# Patient Record
Sex: Male | Born: 1965
Health system: Southern US, Community
[De-identification: ages and names within clinical notes are randomized; demographics above are authoritative.]

## PROBLEM LIST (undated history)

## (undated) DIAGNOSIS — K8020 Calculus of gallbladder without cholecystitis without obstruction: Secondary | ICD-10-CM

## (undated) DIAGNOSIS — K219 Gastro-esophageal reflux disease without esophagitis: Secondary | ICD-10-CM

## (undated) DIAGNOSIS — E785 Hyperlipidemia, unspecified: Secondary | ICD-10-CM

## (undated) DIAGNOSIS — I10 Essential (primary) hypertension: Secondary | ICD-10-CM

## (undated) DIAGNOSIS — I2510 Atherosclerotic heart disease of native coronary artery without angina pectoris: Secondary | ICD-10-CM

## (undated) HISTORY — DX: Essential (primary) hypertension: I10

## (undated) HISTORY — DX: Gastro-esophageal reflux disease without esophagitis: K21.9

## (undated) HISTORY — DX: Atherosclerotic heart disease of native coronary artery without angina pectoris: I25.10

## (undated) HISTORY — DX: Hyperlipidemia, unspecified: E78.5

## (undated) HISTORY — PX: KNEE ARTHROSCOPY: SUR90

---

## 2008-04-22 ENCOUNTER — Encounter: Admission: RE | Admit: 2008-04-22 | Discharge: 2008-04-22 | Payer: Self-pay | Admitting: Orthopedic Surgery

## 2013-05-25 DIAGNOSIS — M25561 Pain in right knee: Secondary | ICD-10-CM | POA: Insufficient documentation

## 2013-05-28 ENCOUNTER — Other Ambulatory Visit: Payer: Self-pay | Admitting: Orthopedic Surgery

## 2013-05-28 DIAGNOSIS — M25561 Pain in right knee: Secondary | ICD-10-CM

## 2013-05-28 DIAGNOSIS — R531 Weakness: Secondary | ICD-10-CM

## 2013-05-30 ENCOUNTER — Ambulatory Visit
Admission: RE | Admit: 2013-05-30 | Discharge: 2013-05-30 | Disposition: A | Discharge status: Home / Self Care | Payer: BC Managed Care – PPO | Source: Ambulatory Visit | Attending: Orthopedic Surgery | Admitting: Orthopedic Surgery

## 2013-05-30 DIAGNOSIS — R531 Weakness: Secondary | ICD-10-CM

## 2013-05-30 DIAGNOSIS — M25561 Pain in right knee: Secondary | ICD-10-CM

## 2013-06-21 DIAGNOSIS — Z9889 Other specified postprocedural states: Secondary | ICD-10-CM | POA: Insufficient documentation

## 2013-06-21 DIAGNOSIS — M545 Low back pain, unspecified: Secondary | ICD-10-CM | POA: Insufficient documentation

## 2016-02-03 DIAGNOSIS — J019 Acute sinusitis, unspecified: Secondary | ICD-10-CM | POA: Diagnosis not present

## 2016-02-03 DIAGNOSIS — R030 Elevated blood-pressure reading, without diagnosis of hypertension: Secondary | ICD-10-CM | POA: Diagnosis not present

## 2016-04-09 DIAGNOSIS — Z0001 Encounter for general adult medical examination with abnormal findings: Secondary | ICD-10-CM | POA: Diagnosis not present

## 2016-04-09 DIAGNOSIS — I10 Essential (primary) hypertension: Secondary | ICD-10-CM | POA: Diagnosis not present

## 2016-04-29 DIAGNOSIS — L400 Psoriasis vulgaris: Secondary | ICD-10-CM | POA: Diagnosis not present

## 2016-12-07 DIAGNOSIS — E782 Mixed hyperlipidemia: Secondary | ICD-10-CM | POA: Diagnosis not present

## 2016-12-07 DIAGNOSIS — I10 Essential (primary) hypertension: Secondary | ICD-10-CM | POA: Diagnosis not present

## 2017-04-12 DIAGNOSIS — N183 Chronic kidney disease, stage 3 (moderate): Secondary | ICD-10-CM | POA: Diagnosis not present

## 2017-04-12 DIAGNOSIS — R61 Generalized hyperhidrosis: Secondary | ICD-10-CM | POA: Diagnosis not present

## 2017-04-12 DIAGNOSIS — R7301 Impaired fasting glucose: Secondary | ICD-10-CM | POA: Diagnosis not present

## 2017-04-12 DIAGNOSIS — Z23 Encounter for immunization: Secondary | ICD-10-CM | POA: Diagnosis not present

## 2017-04-12 DIAGNOSIS — E782 Mixed hyperlipidemia: Secondary | ICD-10-CM | POA: Diagnosis not present

## 2017-04-12 DIAGNOSIS — I10 Essential (primary) hypertension: Secondary | ICD-10-CM | POA: Diagnosis not present

## 2017-04-12 DIAGNOSIS — Z0000 Encounter for general adult medical examination without abnormal findings: Secondary | ICD-10-CM | POA: Diagnosis not present

## 2017-04-29 DIAGNOSIS — I10 Essential (primary) hypertension: Secondary | ICD-10-CM | POA: Diagnosis not present

## 2017-04-29 DIAGNOSIS — E7849 Other hyperlipidemia: Secondary | ICD-10-CM | POA: Diagnosis not present

## 2017-04-29 DIAGNOSIS — N183 Chronic kidney disease, stage 3 (moderate): Secondary | ICD-10-CM | POA: Diagnosis not present

## 2017-04-29 DIAGNOSIS — E669 Obesity, unspecified: Secondary | ICD-10-CM | POA: Diagnosis not present

## 2017-05-06 DIAGNOSIS — I10 Essential (primary) hypertension: Secondary | ICD-10-CM | POA: Diagnosis not present

## 2017-05-31 ENCOUNTER — Encounter (HOSPITAL_COMMUNITY): Payer: Self-pay

## 2018-03-04 DIAGNOSIS — S01512A Laceration without foreign body of oral cavity, initial encounter: Secondary | ICD-10-CM | POA: Diagnosis not present

## 2018-03-10 DIAGNOSIS — Z23 Encounter for immunization: Secondary | ICD-10-CM | POA: Diagnosis not present

## 2018-03-10 DIAGNOSIS — S01512A Laceration without foreign body of oral cavity, initial encounter: Secondary | ICD-10-CM | POA: Diagnosis not present

## 2018-04-20 DIAGNOSIS — Z125 Encounter for screening for malignant neoplasm of prostate: Secondary | ICD-10-CM | POA: Diagnosis not present

## 2018-04-20 DIAGNOSIS — E782 Mixed hyperlipidemia: Secondary | ICD-10-CM | POA: Diagnosis not present

## 2018-04-20 DIAGNOSIS — Z0000 Encounter for general adult medical examination without abnormal findings: Secondary | ICD-10-CM | POA: Diagnosis not present

## 2018-04-20 DIAGNOSIS — I10 Essential (primary) hypertension: Secondary | ICD-10-CM | POA: Diagnosis not present

## 2018-10-19 DIAGNOSIS — I10 Essential (primary) hypertension: Secondary | ICD-10-CM | POA: Diagnosis not present

## 2018-12-18 DIAGNOSIS — Z1159 Encounter for screening for other viral diseases: Secondary | ICD-10-CM | POA: Diagnosis not present

## 2018-12-21 DIAGNOSIS — D122 Benign neoplasm of ascending colon: Secondary | ICD-10-CM | POA: Diagnosis not present

## 2018-12-21 DIAGNOSIS — Z1211 Encounter for screening for malignant neoplasm of colon: Secondary | ICD-10-CM | POA: Diagnosis not present

## 2018-12-21 DIAGNOSIS — D123 Benign neoplasm of transverse colon: Secondary | ICD-10-CM | POA: Diagnosis not present

## 2019-01-31 ENCOUNTER — Encounter: Payer: Self-pay | Admitting: Physical Medicine and Rehabilitation

## 2019-01-31 ENCOUNTER — Telehealth: Payer: Self-pay | Admitting: Physical Medicine and Rehabilitation

## 2019-01-31 ENCOUNTER — Other Ambulatory Visit: Payer: Self-pay

## 2019-01-31 ENCOUNTER — Ambulatory Visit (INDEPENDENT_AMBULATORY_CARE_PROVIDER_SITE_OTHER): Payer: BC Managed Care – PPO | Admitting: Physical Medicine and Rehabilitation

## 2019-01-31 VITALS — BP 146/95 | HR 69 | Ht 68.0 in | Wt 200.0 lb

## 2019-01-31 DIAGNOSIS — M5416 Radiculopathy, lumbar region: Secondary | ICD-10-CM

## 2019-01-31 DIAGNOSIS — M5441 Lumbago with sciatica, right side: Secondary | ICD-10-CM

## 2019-01-31 DIAGNOSIS — Q762 Congenital spondylolisthesis: Secondary | ICD-10-CM

## 2019-01-31 DIAGNOSIS — M48062 Spinal stenosis, lumbar region with neurogenic claudication: Secondary | ICD-10-CM

## 2019-01-31 DIAGNOSIS — M4316 Spondylolisthesis, lumbar region: Secondary | ICD-10-CM

## 2019-01-31 DIAGNOSIS — G8929 Other chronic pain: Secondary | ICD-10-CM

## 2019-01-31 NOTE — Progress Notes (Signed)
Gregory Torres - 53 y.o. male MRN [id number]  Date of birth: 1966/02/06  Office Visit Note: Visit Date: 01/31/2019 PCP: Soundra Pilon, FNP Referred by: No ref. provider found  Subjective: Chief Complaint  Patient presents with   Right Thigh - Pain   Left Thigh - Pain   Right Leg - Pain, Numbness   Lower Back - Pain   HPI: Gregory Torres is a 53 y.o. male who comes in today For evaluation and management of chronic worsening several weeks of midline sacral pain with some referral into both buttocks and the back of both thighs but also down the right leg and more of an L5 distribution.  He is a patient that I last saw him more than 5 years ago.  We completed epidural injection with good relief of his pain that he was having at the time.  He was having more left-sided radicular complaints at that time.  MRI from that time.  Is reviewed again today with images and spine model and shown to the patient.  He is reviewed below.  He did have moderate narrowing at L3-4 with bilateral pars defects at L5-S1 with some foraminal narrowing.  He has not had any new injury he just said the pain started several weeks ago with a fairly abrupt onset without any inciting problem.  Rates his pain as an 8 out of 10 which is constant sharp and burning pain in both buttocks and thighs.  The pain down the leg is into the anterior shin on the right without weakness.  Some paresthesia not as bad as his lower back and buttock pain.  He has had no bowel or bladder issues no focal weakness.  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  He has had some issues with his knees.  He has not played soccer in several months do to the coronavirus.  He does try to stay active otherwise and does try to exercise somewhat.  Symptoms are worse with walking and bending and actually laying flat does not necessarily endorse pain with sitting.  He has tried anti-inflammatories  as well as heat and ice without much relief.  Review of Systems  Constitutional: Negative for chills, fever, malaise/fatigue and weight loss.  HENT: Negative for hearing loss and sinus pain.   Eyes: Negative for blurred vision, double vision and photophobia.  Respiratory: Negative for cough and shortness of breath.   Cardiovascular: Negative for chest pain, palpitations and leg swelling.  Gastrointestinal: Negative for abdominal pain, nausea and vomiting.  Genitourinary: Negative for flank pain.  Musculoskeletal: Positive for back pain and joint pain. Negative for myalgias.       Right hip and leg pain with paresthesia  Skin: Negative for itching and rash.  Neurological: Positive for tingling. Negative for tremors, focal weakness and weakness.  Endo/Heme/Allergies: Negative.   Psychiatric/Behavioral: Negative for depression.  All other systems reviewed and are negative.  Otherwise per HPI.  Assessment & Plan: Visit Diagnoses:  1. Spinal stenosis of lumbar region with neurogenic claudication   2. Lumbar radiculopathy   3. Chronic bilateral low back pain with right-sided sciatica   4. Congenital spondylolysis   5. Spondylolisthesis of lumbar region     Plan: Findings:  Chronic worsening several months of sacral pain some referral in the buttocks and thighs particularly right-sided L5 symptoms.  Old MRI showing moderate stenosis at L3-4 as well as arthritic changes at  L4-5 and bilateral pars defects at L5-S1.  Epidural injection in the past was very beneficial for almost 4 years despite the fact he is pretty active playing soccer.  No real new trauma or other issues.  I do not feel like today there is any imaging that needs to be done.  I think he would benefit from epidural injection from a general approach at L4-5 once again or maybe L5-S1 given his L5 symptoms.  If he did get much relief with that initially I would look at updated MRI of the lumbar spine.  We talked about activity  modification and core strengthening as well as stretching of his hamstrings with the pars defects.  We talked about neural flossing and he is going to start doing that.  He will continue with current medications.  We will see him back for the epidural injection.    Meds & Orders: No orders of the defined types were placed in this encounter.  No orders of the defined types were placed in this encounter.   Follow-up: Return for Right L5-S1 interlaminar steroid injection.   Procedures: No procedures performed  No notes on file   Clinical History: MRI LUMBAR SPINE WITHOUT CONTRAST   Technique:  Multiplanar and multiecho pulse sequences of the lumbar spine were obtained without intravenous contrast.   Comparison: None   Findings: The lowest full intervertebral disk space is labeled L5- S1.  If procedural intervention is to be performed, careful correlation with this numbering strategy is recommended.   The conus medullaris appears unremarkable.  Conus level: L1.   Bilateral pars defects L5 are associated with 9 mm of grade II anterolisthesis of L5 on S1.   Intervertebral disc desiccation noted at all levels between L3 and S1.   Additional findings at individual levels are as follows:   L2-3:  Unremarkable.   L3-4:  Disc bulge noted with central disc protrusion. The AP diameter of the thecal sac is 8 mm compatible with moderate central stenosis. Mild bilateral foraminal stenosis is noted along with mild bilateral subarticular lateral recess stenosis.   L4-5:  Disc bulge with central shallow disc protrusion noted. Bilateral facet arthropathy is present.  There is mild left and borderline right foraminal stenosis.  No overt central stenosis.   L5-S1:  Disc uncovering noted with shallow central disc protrusion, facet arthropathy, and pars defects.  There is also with prominence of disc material in the right neural foramen compatible with disc protrusion.  These findings cause  moderate left and severe right foraminal stenosis.   IMPRESSION:   1.  Multilevel findings as detailed above, but with the most striking finding being the severe right foraminal stenosis at L5-S1 due to pars defects, anterolisthesis, and right foraminal disc protrusion.   He reports that he has never smoked. He has never used smokeless tobacco. No results for input(s): HGBA1C, LABURIC in the last 8760 hours.  Objective:  VS:  HT:5\' 8"  (172.7 cm)    WT:200 lb (90.7 kg)   BMI:30.42     BP:(!) 146/95   HR:69bpm   TEMP: ( )   RESP:  Physical Exam Vitals signs and nursing note reviewed.  Constitutional:      General: He is not in acute distress.    Appearance: He is well-developed.  HENT:     Head: Normocephalic and atraumatic.     Nose: Nose normal.     Mouth/Throat:     Mouth: Mucous membranes are moist.     Pharynx:  Oropharynx is clear.  Eyes:     Conjunctiva/sclera: Conjunctivae normal.     Pupils: Pupils are equal, round, and reactive to light.  Neck:     Musculoskeletal: Normal range of motion and neck supple.     Trachea: No tracheal deviation.  Cardiovascular:     Rate and Rhythm: Normal rate and regular rhythm.     Pulses: Normal pulses.  Pulmonary:     Effort: Pulmonary effort is normal.     Breath sounds: Normal breath sounds.  Abdominal:     General: There is no distension.     Palpations: Abdomen is soft.     Tenderness: There is no guarding or rebound.  Musculoskeletal:        General: No deformity.     Right lower leg: No edema.     Left lower leg: No edema.     Comments: Patient ambulates without aid somewhat slow to rise to full standing position some pain with extension and facet loading of the lumbar spine.  No focal trigger points noted.  Some pain over the right PSIS more than the left.  No pain over the greater trochanters no pain with hip rotation.  He has a positive slump test on the right.  Skin:    General: Skin is warm and dry.     Findings: No  erythema or rash.  Neurological:     General: No focal deficit present.     Mental Status: He is alert and oriented to person, place, and time.     Motor: No abnormal muscle tone.     Coordination: Coordination normal.     Gait: Gait normal.  Psychiatric:        Mood and Affect: Mood normal.        Behavior: Behavior normal.        Thought Content: Thought content normal.     Ortho Exam Imaging: No results found.  Past Medical/Family/Surgical/Social History: Medications & Allergies reviewed per EMR, new medications updated. Patient Active Problem List   Diagnosis Date Noted   Low back pain 06/21/2013   S/P arthroscopy of knee 06/21/2013   Right knee pain 05/25/2013   History reviewed. No pertinent past medical history. History reviewed. No pertinent family history. History reviewed. No pertinent surgical history. Social History   Occupational History   Not on file  Tobacco Use   Smoking status: Never Smoker   Smokeless tobacco: Never Used  Substance and Sexual Activity   Alcohol use: Not on file   Drug use: Not on file   Sexual activity: Not on file

## 2019-01-31 NOTE — Progress Notes (Signed)
.  Numeric Pain Rating Scale and Functional Assessment Average Pain 8 Pain Right Now 7 My pain is constant, sharp and burning Pain is worse with: walking and bending Pain improves with: nothing   In the last MONTH (on 0-10 scale) has pain interfered with the following?  1. General activity like being  able to carry out your everyday physical activities such as walking, climbing stairs, carrying groceries, or moving a chair?  Rating(8)  2. Relation with others like being able to carry out your usual social activities and roles such as  activities at home, at work and in your community. Rating(8)  3. Enjoyment of life such that you have  been bothered by emotional problems such as feeling anxious, depressed or irritable?  Rating(3)

## 2019-02-01 NOTE — Telephone Encounter (Signed)
PA has been initiated faxed notes to [phone]

## 2019-02-07 NOTE — Telephone Encounter (Signed)
Faxed more clinical notes, case [id number] is still pending.

## 2019-02-12 ENCOUNTER — Encounter: Payer: BC Managed Care – PPO | Admitting: Physical Medicine and Rehabilitation

## 2019-02-12 ENCOUNTER — Other Ambulatory Visit: Payer: Self-pay | Admitting: Physical Medicine and Rehabilitation

## 2019-02-12 ENCOUNTER — Telehealth: Payer: Self-pay | Admitting: *Deleted

## 2019-02-12 DIAGNOSIS — M48062 Spinal stenosis, lumbar region with neurogenic claudication: Secondary | ICD-10-CM

## 2019-02-12 DIAGNOSIS — M5416 Radiculopathy, lumbar region: Secondary | ICD-10-CM

## 2019-02-12 MED ORDER — PREDNISONE 50 MG PO TABS: ORAL_TABLET | ORAL | 0 refills | Status: DC

## 2019-02-12 MED ORDER — MELOXICAM 15 MG PO TABS: 15.0000 mg | ORAL_TABLET | Freq: Every day | ORAL | 0 refills | Status: DC

## 2019-02-12 NOTE — Telephone Encounter (Signed)
Best we can offer is prednisone for 3 to 5 days with other anti-inflammatory and Pt/short course at Dr. Ronnie Derby office, our office or [facility] - if no better then OV to try and get epidural approval. If worsens will think about new MRI if they would approve that.

## 2019-02-12 NOTE — Telephone Encounter (Signed)
Both sent to pharmacy on record. Instructions on medication for meloxicam to start after prednisone finished. Also note it could increase his blood pressure temporarily. Not a long term medication.

## 2019-02-12 NOTE — Telephone Encounter (Signed)
Called pt and advised per Dr. Newton.  

## 2019-02-12 NOTE — Telephone Encounter (Signed)
Patient would like to try prednisone and anti-inflammatory.

## 2019-03-08 DIAGNOSIS — L218 Other seborrheic dermatitis: Secondary | ICD-10-CM | POA: Diagnosis not present

## 2019-03-08 DIAGNOSIS — L821 Other seborrheic keratosis: Secondary | ICD-10-CM | POA: Diagnosis not present

## 2019-03-08 DIAGNOSIS — L814 Other melanin hyperpigmentation: Secondary | ICD-10-CM | POA: Diagnosis not present

## 2019-03-08 DIAGNOSIS — B078 Other viral warts: Secondary | ICD-10-CM | POA: Diagnosis not present

## 2019-03-09 ENCOUNTER — Other Ambulatory Visit: Payer: Self-pay | Admitting: Physical Medicine and Rehabilitation

## 2019-03-09 NOTE — Telephone Encounter (Signed)
Please advise 

## 2019-03-27 DIAGNOSIS — M25562 Pain in left knee: Secondary | ICD-10-CM | POA: Diagnosis not present

## 2019-04-02 ENCOUNTER — Other Ambulatory Visit: Payer: Self-pay | Admitting: Physical Medicine and Rehabilitation

## 2019-04-02 NOTE — Telephone Encounter (Signed)
Please advise 

## 2019-04-25 ENCOUNTER — Other Ambulatory Visit: Payer: Self-pay | Admitting: Physical Medicine and Rehabilitation

## 2019-04-25 NOTE — Telephone Encounter (Signed)
Please advise 

## 2019-04-26 DIAGNOSIS — E782 Mixed hyperlipidemia: Secondary | ICD-10-CM | POA: Diagnosis not present

## 2019-04-26 DIAGNOSIS — Z23 Encounter for immunization: Secondary | ICD-10-CM | POA: Diagnosis not present

## 2019-04-26 DIAGNOSIS — Z125 Encounter for screening for malignant neoplasm of prostate: Secondary | ICD-10-CM | POA: Diagnosis not present

## 2019-04-26 DIAGNOSIS — Z0000 Encounter for general adult medical examination without abnormal findings: Secondary | ICD-10-CM | POA: Diagnosis not present

## 2019-04-26 DIAGNOSIS — N183 Chronic kidney disease, stage 3 unspecified: Secondary | ICD-10-CM | POA: Diagnosis not present

## 2019-05-25 ENCOUNTER — Other Ambulatory Visit: Payer: Self-pay | Admitting: Physical Medicine and Rehabilitation

## 2019-05-25 NOTE — Telephone Encounter (Signed)
Please advise 

## 2019-10-24 DIAGNOSIS — I10 Essential (primary) hypertension: Secondary | ICD-10-CM | POA: Diagnosis not present

## 2019-11-01 DIAGNOSIS — M7050 Other bursitis of knee, unspecified knee: Secondary | ICD-10-CM | POA: Diagnosis not present

## 2019-11-01 DIAGNOSIS — G8929 Other chronic pain: Secondary | ICD-10-CM | POA: Diagnosis not present

## 2019-11-01 DIAGNOSIS — M25562 Pain in left knee: Secondary | ICD-10-CM | POA: Diagnosis not present

## 2020-03-03 DIAGNOSIS — D2239 Melanocytic nevi of other parts of face: Secondary | ICD-10-CM | POA: Diagnosis not present

## 2020-03-03 DIAGNOSIS — L821 Other seborrheic keratosis: Secondary | ICD-10-CM | POA: Diagnosis not present

## 2020-03-03 DIAGNOSIS — D2261 Melanocytic nevi of right upper limb, including shoulder: Secondary | ICD-10-CM | POA: Diagnosis not present

## 2020-03-03 DIAGNOSIS — D224 Melanocytic nevi of scalp and neck: Secondary | ICD-10-CM | POA: Diagnosis not present

## 2020-03-03 DIAGNOSIS — L820 Inflamed seborrheic keratosis: Secondary | ICD-10-CM | POA: Diagnosis not present

## 2020-05-02 DIAGNOSIS — Z125 Encounter for screening for malignant neoplasm of prostate: Secondary | ICD-10-CM | POA: Diagnosis not present

## 2020-05-02 DIAGNOSIS — E782 Mixed hyperlipidemia: Secondary | ICD-10-CM | POA: Diagnosis not present

## 2020-05-02 DIAGNOSIS — Z0000 Encounter for general adult medical examination without abnormal findings: Secondary | ICD-10-CM | POA: Diagnosis not present

## 2020-05-02 DIAGNOSIS — I10 Essential (primary) hypertension: Secondary | ICD-10-CM | POA: Diagnosis not present

## 2020-05-16 ENCOUNTER — Other Ambulatory Visit: Payer: Self-pay | Admitting: Orthopedic Surgery

## 2020-05-16 DIAGNOSIS — M25562 Pain in left knee: Secondary | ICD-10-CM

## 2020-05-28 ENCOUNTER — Ambulatory Visit
Admission: RE | Admit: 2020-05-28 | Discharge: 2020-05-28 | Disposition: A | Discharge status: Home / Self Care | Payer: BC Managed Care – PPO | Source: Ambulatory Visit | Attending: Orthopedic Surgery | Admitting: Orthopedic Surgery

## 2020-05-28 ENCOUNTER — Other Ambulatory Visit: Payer: Self-pay

## 2020-05-28 DIAGNOSIS — M25562 Pain in left knee: Secondary | ICD-10-CM

## 2020-05-30 ENCOUNTER — Other Ambulatory Visit: Payer: BC Managed Care – PPO

## 2020-06-04 DIAGNOSIS — M6752 Plica syndrome, left knee: Secondary | ICD-10-CM | POA: Diagnosis not present

## 2020-06-04 DIAGNOSIS — X58XXXA Exposure to other specified factors, initial encounter: Secondary | ICD-10-CM | POA: Diagnosis not present

## 2020-06-04 DIAGNOSIS — S83232A Complex tear of medial meniscus, current injury, left knee, initial encounter: Secondary | ICD-10-CM | POA: Diagnosis not present

## 2020-06-04 DIAGNOSIS — M659 Synovitis and tenosynovitis, unspecified: Secondary | ICD-10-CM | POA: Diagnosis not present

## 2020-06-04 DIAGNOSIS — M11262 Other chondrocalcinosis, left knee: Secondary | ICD-10-CM | POA: Diagnosis not present

## 2020-06-04 DIAGNOSIS — Y999 Unspecified external cause status: Secondary | ICD-10-CM | POA: Diagnosis not present

## 2020-06-04 DIAGNOSIS — M65862 Other synovitis and tenosynovitis, left lower leg: Secondary | ICD-10-CM | POA: Diagnosis not present

## 2020-06-04 DIAGNOSIS — M94262 Chondromalacia, left knee: Secondary | ICD-10-CM | POA: Diagnosis not present

## 2020-06-04 DIAGNOSIS — S83272A Complex tear of lateral meniscus, current injury, left knee, initial encounter: Secondary | ICD-10-CM | POA: Diagnosis not present

## 2020-06-04 DIAGNOSIS — G8918 Other acute postprocedural pain: Secondary | ICD-10-CM | POA: Diagnosis not present

## 2020-06-10 DIAGNOSIS — M25562 Pain in left knee: Secondary | ICD-10-CM | POA: Diagnosis not present

## 2020-06-10 DIAGNOSIS — Z7409 Other reduced mobility: Secondary | ICD-10-CM | POA: Diagnosis not present

## 2020-06-10 DIAGNOSIS — M25662 Stiffness of left knee, not elsewhere classified: Secondary | ICD-10-CM | POA: Diagnosis not present

## 2020-06-10 DIAGNOSIS — Z9889 Other specified postprocedural states: Secondary | ICD-10-CM | POA: Diagnosis not present

## 2020-06-18 DIAGNOSIS — Z7409 Other reduced mobility: Secondary | ICD-10-CM | POA: Diagnosis not present

## 2020-06-18 DIAGNOSIS — M25562 Pain in left knee: Secondary | ICD-10-CM | POA: Diagnosis not present

## 2020-06-18 DIAGNOSIS — M25662 Stiffness of left knee, not elsewhere classified: Secondary | ICD-10-CM | POA: Diagnosis not present

## 2020-06-18 DIAGNOSIS — Z9889 Other specified postprocedural states: Secondary | ICD-10-CM | POA: Diagnosis not present

## 2020-06-24 DIAGNOSIS — M25662 Stiffness of left knee, not elsewhere classified: Secondary | ICD-10-CM | POA: Diagnosis not present

## 2020-06-24 DIAGNOSIS — R29898 Other symptoms and signs involving the musculoskeletal system: Secondary | ICD-10-CM | POA: Diagnosis not present

## 2020-06-24 DIAGNOSIS — Z7409 Other reduced mobility: Secondary | ICD-10-CM | POA: Diagnosis not present

## 2020-06-24 DIAGNOSIS — M25562 Pain in left knee: Secondary | ICD-10-CM | POA: Diagnosis not present

## 2020-07-03 DIAGNOSIS — M25562 Pain in left knee: Secondary | ICD-10-CM | POA: Diagnosis not present

## 2020-07-03 DIAGNOSIS — R29898 Other symptoms and signs involving the musculoskeletal system: Secondary | ICD-10-CM | POA: Diagnosis not present

## 2020-07-03 DIAGNOSIS — Z9889 Other specified postprocedural states: Secondary | ICD-10-CM | POA: Diagnosis not present

## 2020-07-03 DIAGNOSIS — M25662 Stiffness of left knee, not elsewhere classified: Secondary | ICD-10-CM | POA: Diagnosis not present

## 2020-08-14 DIAGNOSIS — E782 Mixed hyperlipidemia: Secondary | ICD-10-CM | POA: Insufficient documentation

## 2020-08-14 DIAGNOSIS — R7301 Impaired fasting glucose: Secondary | ICD-10-CM | POA: Insufficient documentation

## 2020-08-14 DIAGNOSIS — Z9889 Other specified postprocedural states: Secondary | ICD-10-CM | POA: Diagnosis not present

## 2020-08-14 DIAGNOSIS — M1189 Other specified crystal arthropathies, multiple sites: Secondary | ICD-10-CM | POA: Diagnosis not present

## 2020-08-14 DIAGNOSIS — I10 Essential (primary) hypertension: Secondary | ICD-10-CM | POA: Insufficient documentation

## 2020-10-02 NOTE — Progress Notes (Signed)
Cardiology Office Note:   Date:  10/03/2020  NAME:  Gregory Torres    MRN: [id number] DOB:  12-26-1965   PCP:  Soundra Pilon, FNP  Cardiologist:  None  Electrophysiologist:  None   Referring MD: Soundra Pilon, FNP   Chief Complaint  Patient presents with   Chest Pain    History of Present Illness:   Gregory Torres is a 55 y.o. male with a hx of arthritis who is being seen today for the evaluation of chest pain at the request of Soundra Pilon, FNP.  He reports he was followed by cardiology 3 years ago.  Had no major symptoms but had a EKG and echocardiogram that were normal.  He was planned to have a stress test for unclear reasons but did not complete this due to the coronavirus pandemic.  He reports that he has had no major symptoms up until 3 weeks ago.  He reports he had reflux symptoms and vomited.  He reports since then has had pain in his chest.  Described as sharp and achy.  Occurs randomly.  He reports he is having in the office today.  No identifiable triggers.  Symptoms last seconds to minutes and resolved.  His EKG demonstrates sinus bradycardia with no acute ischemic changes or evidence of infarction.  Medical history is significant for hypertension that is well controlled.  BP 132/86.  He also has high cholesterol.  He takes Lipitor 10 mg daily.  Most recent LDL 85.  He reports that his father had a heart attack and stent placed.  His father does smoke.  He does not smoke.  He drinks alcohol in moderation.  No drug use is reported.  He is a Designer, industrial/product.  He reports 1 child.  He is married.  He reports that he is not exercising much right now due to knee arthritis.  He used to play pretty competitive soccer per his report.  He reports no exertional chest pain symptoms.  No exertional shortness of breath.  Symptoms of chest pain appear to be atypical and occur randomly.  They are not getting better.  He does take omeprazole for acid reflux.  Cardiovascular examination is  normal.  Problem list 1.  Hypertension 2.  Hyperlipidemia - Total cholesterol 146, HDL 44, LDL 85, triglycerides 90  E5I 5.1, TSH 1.42  Past Medical History: Past Medical History:  Diagnosis Date   GERD (gastroesophageal reflux disease)    Hypertension     Past Surgical History: Past Surgical History:  Procedure Laterality Date   KNEE ARTHROSCOPY      Current Medications: Current Meds  Medication Sig   amlodipine-atorvastatin (CADUET) 10-10 MG tablet Take 1 tablet by mouth daily.   metoprolol tartrate (LOPRESSOR) 25 MG tablet Take 2 hours prior to CT scan   omeprazole (PRILOSEC) 10 MG capsule Take by mouth.     Allergies:    Patient has no allergy information on record.   Social History: Social History   Socioeconomic History   Marital status: Married    Spouse name: Not on file   Number of children: 1   Years of education: Not on file   Highest education level: Not on file  Occupational History   Occupation: Warehouse management  Tobacco Use   Smoking status: Never   Smokeless tobacco: Never  Substance and Sexual Activity   Alcohol use: Yes   Drug use: Never   Sexual activity: Not on file  Other Topics Concern  Not on file  Social History Narrative   Not on file   Social Determinants of Health   Financial Resource Strain: Not on file  Food Insecurity: Not on file  Transportation Needs: Not on file  Physical Activity: Not on file  Stress: Not on file  Social Connections: Not on file     Family History: The patient's family history includes Heart disease in his father; Hyperlipidemia in his brother.  ROS:   All other ROS reviewed and negative. Pertinent positives noted in the HPI.     EKGs/Labs/Other Studies Reviewed:   The following studies were personally reviewed by me today:  EKG:  EKG is ordered today.  The ekg ordered today demonstrates sinus bradycardia heart rate 58, no acute ischemic changes or evidence of infarction, and was  personally reviewed by me.   Recent Labs: No results found for requested labs within last 8760 hours.   Recent Lipid Panel No results found for: CHOL, TRIG, HDL, CHOLHDL, VLDL, LDLCALC, LDLDIRECT  Physical Exam:   VS:  BP 132/86 (BP Location: Right Arm, Patient Position: Sitting, Cuff Size: Large)   Pulse (!) 58   Ht 5\' 7"  (1.702 m)   Wt 199 lb 9.6 oz (90.5 kg)   SpO2 99%   BMI 31.26 kg/m    Wt Readings from Last 3 Encounters:  10/03/20 199 lb 9.6 oz (90.5 kg)  01/31/19 200 lb (90.7 kg)    General: Well nourished, well developed, in no acute distress Head: Atraumatic, normal size  Eyes: PEERLA, EOMI  Neck: Supple, no JVD Endocrine: No thryomegaly Cardiac: Normal S1, S2; RRR; no murmurs, rubs, or gallops Lungs: Clear to auscultation bilaterally, no wheezing, rhonchi or rales  Abd: Soft, nontender, no hepatomegaly  Ext: No edema, pulses 2+ Musculoskeletal: No deformities, BUE and BLE strength normal and equal Skin: Warm and dry, no rashes   Neuro: Alert and oriented to person, place, time, and situation, CNII-XII grossly intact, no focal deficits  Psych: Normal mood and affect   ASSESSMENT:   Gregory Torres is a 55 y.o. male who presents for the following: 1. Chest pain, unspecified type   2. Primary hypertension   3. Mixed hyperlipidemia   4. Obesity (BMI 30-39.9)   5. Precordial pain     PLAN:   1. Chest pain, unspecified type -Atypical chest pain.  Likely acid reflux.  Does have family history of heart disease.  Has hypertension hyperlipidemia.  EKG normal in office.  Cardiovascular exam normal in office.  We will proceed with coronary CTA.  BMP today.  He will take 25 mg of metoprolol tartrate 2 hours before the scan.  Follow-up will be dictated by his scan.  2. Primary hypertension -Well-controlled today no change in medication.  3. Mixed hyperlipidemia -Most recent LDL less than 100.  Further titration pending coronary CTA.  4. Obesity (BMI 30-39.9) -Diet and  exercise recommended.  Disposition: Return if symptoms worsen or fail to improve.  Medication Adjustments/Labs and Tests Ordered: Current medicines are reviewed at length with the patient today.  Concerns regarding medicines are outlined above.  Orders Placed This Encounter  Procedures   CT CORONARY MORPH W/CTA COR W/SCORE W/CA W/CM &/OR WO/CM   Basic Metabolic Panel (BMET)   EKG 12-Lead    Meds ordered this encounter  Medications   metoprolol tartrate (LOPRESSOR) 25 MG tablet    Sig: Take 2 hours prior to CT scan    Dispense:  1 tablet    Refill:  0  Patient Instructions   Lab Work:  Your physician recommends that you HAVE LAB WORK TODAY  If you have labs (blood work) drawn today and your tests are completely normal, you will receive your results only by: MyChart Message (if you have MyChart) OR A paper copy in the mail If you have any lab test that is abnormal or we need to change your treatment, we will call you to review the results.   Testing/Procedures:    Your cardiac CT will be scheduled at  [facility] [address] [place], [place] [address] [phone]  If scheduled at [facility], please arrive at the [facility] main entrance (entrance A) of [facility] 30 minutes prior to test start time. Proceed to the [facility] Radiology Department (first floor) to check-in and test prep.  Please follow these instructions carefully (unless otherwise directed):  Hold all erectile dysfunction medications at least 3 days (72 hrs) prior to test.  On the Night Before the Test: Be sure to Drink plenty of water. Do not consume any caffeinated/decaffeinated beverages or chocolate 12 hours prior to your test. Do not take any antihistamines 12 hours prior to your test.   On the Day of the Test: Drink plenty of water until 1 hour prior to the test. Do not eat any food 4 hours prior to the test. You may take your regular medications  prior to the test.  Take metoprolol (Lopressor) 25 MG two hours prior to test. HOLD Furosemide/Hydrochlorothiazide morning of the test. FEMALES- please wear underwire-free bra if available, avoid dresses & tight clothing  After the Test: Drink plenty of water. After receiving IV contrast, you may experience a mild flushed feeling. This is normal. On occasion, you may experience a mild rash up to 24 hours after the test. This is not dangerous. If this occurs, you can take Benadryl 25 mg and increase your fluid intake. If you experience trouble breathing, this can be serious. If it is severe call 911 IMMEDIATELY. If it is mild, please call our office. If you take any of these medications: Glipizide/Metformin, Avandament, Glucavance, please do not take 48 hours after completing test unless otherwise instructed.  Please allow 2-4 weeks for scheduling of routine cardiac CTs. Some insurance companies require a pre-authorization which may delay scheduling of this test.   For non-scheduling related questions, please contact the cardiac imaging nurse navigator should you have any questions/concerns: Rockwell Alexandria, Cardiac Imaging Nurse Navigator Larey Brick, Cardiac Imaging Nurse Navigator [facility] Direct Office Dial: [phone]   For scheduling needs, including cancellations and rescheduling, please call [place], [phone].    Follow-Up: At [facility], you and your health needs are our priority.  As part of our continuing mission to provide you with exceptional heart care, we have created designated Provider Care Teams.  These Care Teams include your primary Cardiologist (physician) and Advanced Practice Providers (APPs -  Physician Assistants and Nurse Practitioners) who all work together to provide you with the care you need, when you need it.  We recommend signing up for the patient portal called "MyChart".  Sign up information is provided on this After  Visit Summary.  MyChart is used to connect with patients for Virtual Visits (Telemedicine).  Patients are able to view lab/test results, encounter notes, upcoming appointments, etc.  Non-urgent messages can be sent to your provider as well.   To learn more about what you can do with MyChart, go to  [email or web address].    Your next appointment:    AS NEEDED   Time Spent with Patient: I have spent a total of 35 minutes with patient reviewing hospital notes, telemetry, EKGs, labs and examining the patient as well as establishing an assessment and plan that was discussed with the patient.  > 50% of time was spent in direct patient care.  Signed, Lenna Gilford. Flora Lipps, MD, [facility]  [facility]  [address] [place], [place] [address] [phone]  10/03/2020 8:49 AM

## 2020-10-03 ENCOUNTER — Ambulatory Visit: Payer: BC Managed Care – PPO | Admitting: Cardiovascular Disease

## 2020-10-03 ENCOUNTER — Encounter: Payer: Self-pay | Admitting: Cardiovascular Disease

## 2020-10-03 ENCOUNTER — Other Ambulatory Visit: Payer: Self-pay

## 2020-10-03 VITALS — BP 132/86 | HR 58 | Ht 67.0 in | Wt 199.6 lb

## 2020-10-03 DIAGNOSIS — I10 Essential (primary) hypertension: Secondary | ICD-10-CM | POA: Diagnosis not present

## 2020-10-03 DIAGNOSIS — E669 Obesity, unspecified: Secondary | ICD-10-CM | POA: Diagnosis not present

## 2020-10-03 DIAGNOSIS — E782 Mixed hyperlipidemia: Secondary | ICD-10-CM | POA: Diagnosis not present

## 2020-10-03 DIAGNOSIS — R079 Chest pain, unspecified: Secondary | ICD-10-CM | POA: Diagnosis not present

## 2020-10-03 DIAGNOSIS — R072 Precordial pain: Secondary | ICD-10-CM | POA: Diagnosis not present

## 2020-10-03 LAB — BASIC METABOLIC PANEL
BUN/Creatinine Ratio: 12 (ref 9–20)
BUN: 13 mg/dL (ref 6–24)
CO2: 25 mmol/L (ref 20–29)
Calcium: 9.8 mg/dL (ref 8.7–10.2)
Chloride: 100 mmol/L (ref 96–106)
Creatinine, Ser: 1.1 mg/dL (ref 0.76–1.27)
Glucose: 82 mg/dL (ref 65–99)
Potassium: 4.2 mmol/L (ref 3.5–5.2)
Sodium: 141 mmol/L (ref 134–144)
eGFR: 80 mL/min/{1.73_m2} (ref >59)

## 2020-10-03 MED ORDER — METOPROLOL TARTRATE 25 MG PO TABS: ORAL_TABLET | ORAL | 0 refills | Status: DC

## 2020-10-03 NOTE — Patient Instructions (Signed)
Lab Work:  Your physician recommends that you HAVE LAB WORK TODAY  If you have labs (blood work) drawn today and your tests are completely normal, you will receive your results only by: MyChart Message (if you have MyChart) OR A paper copy in the mail If you have any lab test that is abnormal or we need to change your treatment, we will call you to review the results.   Testing/Procedures:    Your cardiac CT will be scheduled at  [facility] [address] [place], [place] [address] [phone]  If scheduled at [facility], please arrive at the [facility] main entrance (entrance A) of [facility] 30 minutes prior to test start time. Proceed to the [facility] Radiology Department (first floor) to check-in and test prep.  Please follow these instructions carefully (unless otherwise directed):  Hold all erectile dysfunction medications at least 3 days (72 hrs) prior to test.  On the Night Before the Test: Be sure to Drink plenty of water. Do not consume any caffeinated/decaffeinated beverages or chocolate 12 hours prior to your test. Do not take any antihistamines 12 hours prior to your test.   On the Day of the Test: Drink plenty of water until 1 hour prior to the test. Do not eat any food 4 hours prior to the test. You may take your regular medications prior to the test.  Take metoprolol (Lopressor) 25 MG two hours prior to test. HOLD Furosemide/Hydrochlorothiazide morning of the test. FEMALES- please wear underwire-free bra if available, avoid dresses & tight clothing  After the Test: Drink plenty of water. After receiving IV contrast, you may experience a mild flushed feeling. This is normal. On occasion, you may experience a mild rash up to 24 hours after the test. This is not dangerous. If this occurs, you can take Benadryl 25 mg and increase your fluid intake. If you experience trouble breathing, this can be serious. If it is severe  call 911 IMMEDIATELY. If it is mild, please call our office. If you take any of these medications: Glipizide/Metformin, Avandament, Glucavance, please do not take 48 hours after completing test unless otherwise instructed.  Please allow 2-4 weeks for scheduling of routine cardiac CTs. Some insurance companies require a pre-authorization which may delay scheduling of this test.   For non-scheduling related questions, please contact the cardiac imaging nurse navigator should you have any questions/concerns: Rockwell Alexandria, Cardiac Imaging Nurse Navigator Larey Brick, Cardiac Imaging Nurse Navigator [facility] Direct Office Dial: [phone]   For scheduling needs, including cancellations and rescheduling, please call [place], [phone].    Follow-Up: At [facility], you and your health needs are our priority.  As part of our continuing mission to provide you with exceptional heart care, we have created designated Provider Care Teams.  These Care Teams include your primary Cardiologist (physician) and Advanced Practice Providers (APPs -  Physician Assistants and Nurse Practitioners) who all work together to provide you with the care you need, when you need it.  We recommend signing up for the patient portal called "MyChart".  Sign up information is provided on this After Visit Summary.  MyChart is used to connect with patients for Virtual Visits (Telemedicine).  Patients are able to view lab/test results, encounter notes, upcoming appointments, etc.  Non-urgent messages can be sent to your provider as well.   To learn more about what you can do with MyChart, go to [email or web address].    Your  next appointment:    AS NEEDED

## 2020-10-10 ENCOUNTER — Telehealth (HOSPITAL_COMMUNITY): Payer: Self-pay | Admitting: Emergency Medicine

## 2020-10-10 NOTE — Telephone Encounter (Signed)
Reaching out to patient to offer assistance regarding upcoming cardiac imaging study; pt verbalizes understanding of appt date/time, parking situation and where to check in, pre-test NPO status and medications ordered, and verified current allergies; name and call back number provided for further questions should they arise Rockwell Alexandria RN Navigator Cardiac Imaging [facility] [phone] office [phone] cell   Denies IV issues Some claustro 25mg  metoprolol tart

## 2020-10-14 ENCOUNTER — Other Ambulatory Visit: Payer: Self-pay

## 2020-10-14 ENCOUNTER — Other Ambulatory Visit: Payer: Self-pay | Admitting: Cardiology

## 2020-10-14 ENCOUNTER — Ambulatory Visit (HOSPITAL_COMMUNITY)
Admission: RE | Admit: 2020-10-14 | Discharge: 2020-10-14 | Disposition: A | Discharge status: Home / Self Care | Payer: BC Managed Care – PPO | Source: Ambulatory Visit | Attending: Cardiology | Admitting: Cardiology

## 2020-10-14 ENCOUNTER — Ambulatory Visit (HOSPITAL_COMMUNITY)
Admission: RE | Admit: 2020-10-14 | Discharge: 2020-10-14 | Disposition: A | Discharge status: Home / Self Care | Payer: BC Managed Care – PPO | Source: Ambulatory Visit | Attending: Cardiovascular Disease | Admitting: Cardiovascular Disease

## 2020-10-14 DIAGNOSIS — R931 Abnormal findings on diagnostic imaging of heart and coronary circulation: Secondary | ICD-10-CM

## 2020-10-14 DIAGNOSIS — R072 Precordial pain: Secondary | ICD-10-CM | POA: Diagnosis not present

## 2020-10-14 DIAGNOSIS — I2510 Atherosclerotic heart disease of native coronary artery without angina pectoris: Secondary | ICD-10-CM | POA: Diagnosis not present

## 2020-10-14 MED ORDER — IOHEXOL 350 MG/ML SOLN
95.0000 mL | Freq: Once | INTRAVENOUS | Status: AC | PRN
  Administered 2020-10-14: 95 mL via INTRAVENOUS

## 2020-10-14 MED ORDER — NITROGLYCERIN 0.4 MG SL SUBL
SUBLINGUAL_TABLET | SUBLINGUAL | Status: DC
Start: 2020-10-14 — End: 2020-10-14
  Filled 2020-10-14: qty 2

## 2020-10-14 MED ORDER — NITROGLYCERIN 0.4 MG SL SUBL
0.8000 mg | SUBLINGUAL_TABLET | Freq: Once | SUBLINGUAL | Status: AC
  Administered 2020-10-14: 0.8 mg via SUBLINGUAL

## 2020-10-17 ENCOUNTER — Telehealth: Payer: Self-pay | Admitting: Cardiovascular Disease

## 2020-10-17 ENCOUNTER — Other Ambulatory Visit: Payer: Self-pay

## 2020-10-17 ENCOUNTER — Other Ambulatory Visit: Payer: Self-pay | Admitting: *Deleted

## 2020-10-17 MED ORDER — AMLODIPINE BESYLATE 10 MG PO TABS: 10.0000 mg | ORAL_TABLET | Freq: Every day | ORAL | Status: AC

## 2020-10-17 MED ORDER — ROSUVASTATIN CALCIUM 20 MG PO TABS: 20.0000 mg | ORAL_TABLET | Freq: Every day | ORAL | 3 refills | Status: DC

## 2020-10-17 NOTE — Telephone Encounter (Signed)
Patient returning call.

## 2020-10-17 NOTE — Telephone Encounter (Signed)
Patient reviewed advisement from Dr. Flora Lipps regarding his CT. He states he is already takinbg Crestor for his cholesterol and he would like to know if he needs to take both medications. Please advise.

## 2020-10-17 NOTE — Telephone Encounter (Signed)
Patient is calling . Patient  states the medication he takes is  Amlodipine 10 mg Rosuvastatin 5 mg Prilosec   Patient stated the medication list from  office visit on 8/5/ is incorrect He is not taking amlodipine/atorvastatin. Per result note from Dr Val EagleJennette Kettle wanted patient increase atorvastatin 20 mg     RN informed patient will discuss with Dr Val EagleJennette Kettle an contact patient

## 2020-10-17 NOTE — Telephone Encounter (Signed)
Dr Flora Lipps reviewed Dr Flora Lipps nurse spoke to patient  and new order sent pharmacy

## 2020-10-17 NOTE — Telephone Encounter (Signed)
Attempted to call patient, left message for patient to call back to office.   

## 2020-11-07 DIAGNOSIS — M25562 Pain in left knee: Secondary | ICD-10-CM | POA: Diagnosis not present

## 2020-11-07 DIAGNOSIS — I10 Essential (primary) hypertension: Secondary | ICD-10-CM | POA: Diagnosis not present

## 2020-11-07 DIAGNOSIS — Z23 Encounter for immunization: Secondary | ICD-10-CM | POA: Diagnosis not present

## 2021-01-12 DIAGNOSIS — M1189 Other specified crystal arthropathies, multiple sites: Secondary | ICD-10-CM | POA: Diagnosis not present

## 2021-03-23 DIAGNOSIS — R059 Cough, unspecified: Secondary | ICD-10-CM | POA: Diagnosis not present

## 2021-03-23 DIAGNOSIS — R079 Chest pain, unspecified: Secondary | ICD-10-CM | POA: Diagnosis not present

## 2021-03-23 DIAGNOSIS — M1180 Other specified crystal arthropathies, unspecified site: Secondary | ICD-10-CM | POA: Diagnosis not present

## 2021-04-20 DIAGNOSIS — M11262 Other chondrocalcinosis, left knee: Secondary | ICD-10-CM | POA: Diagnosis not present

## 2021-04-20 DIAGNOSIS — M170 Bilateral primary osteoarthritis of knee: Secondary | ICD-10-CM | POA: Diagnosis not present

## 2021-05-08 DIAGNOSIS — R7301 Impaired fasting glucose: Secondary | ICD-10-CM | POA: Diagnosis not present

## 2021-05-08 DIAGNOSIS — N183 Chronic kidney disease, stage 3 unspecified: Secondary | ICD-10-CM | POA: Diagnosis not present

## 2021-05-08 DIAGNOSIS — Z0000 Encounter for general adult medical examination without abnormal findings: Secondary | ICD-10-CM | POA: Diagnosis not present

## 2021-05-08 DIAGNOSIS — I10 Essential (primary) hypertension: Secondary | ICD-10-CM | POA: Diagnosis not present

## 2021-05-08 DIAGNOSIS — M25569 Pain in unspecified knee: Secondary | ICD-10-CM | POA: Diagnosis not present

## 2021-05-08 DIAGNOSIS — E782 Mixed hyperlipidemia: Secondary | ICD-10-CM | POA: Diagnosis not present

## 2021-05-08 DIAGNOSIS — Z125 Encounter for screening for malignant neoplasm of prostate: Secondary | ICD-10-CM | POA: Diagnosis not present

## 2021-05-08 DIAGNOSIS — Z8261 Family history of arthritis: Secondary | ICD-10-CM | POA: Diagnosis not present

## 2021-07-02 DIAGNOSIS — H109 Unspecified conjunctivitis: Secondary | ICD-10-CM | POA: Diagnosis not present

## 2021-08-27 DIAGNOSIS — K219 Gastro-esophageal reflux disease without esophagitis: Secondary | ICD-10-CM | POA: Diagnosis not present

## 2021-08-27 DIAGNOSIS — R1013 Epigastric pain: Secondary | ICD-10-CM | POA: Diagnosis not present

## 2021-10-07 DIAGNOSIS — M25562 Pain in left knee: Secondary | ICD-10-CM | POA: Diagnosis not present

## 2021-10-14 DIAGNOSIS — L538 Other specified erythematous conditions: Secondary | ICD-10-CM | POA: Diagnosis not present

## 2021-10-14 DIAGNOSIS — L814 Other melanin hyperpigmentation: Secondary | ICD-10-CM | POA: Diagnosis not present

## 2021-10-14 DIAGNOSIS — B078 Other viral warts: Secondary | ICD-10-CM | POA: Diagnosis not present

## 2021-10-14 DIAGNOSIS — D225 Melanocytic nevi of trunk: Secondary | ICD-10-CM | POA: Diagnosis not present

## 2021-10-14 DIAGNOSIS — L821 Other seborrheic keratosis: Secondary | ICD-10-CM | POA: Diagnosis not present

## 2021-10-20 DIAGNOSIS — R112 Nausea with vomiting, unspecified: Secondary | ICD-10-CM | POA: Diagnosis not present

## 2021-10-20 DIAGNOSIS — R12 Heartburn: Secondary | ICD-10-CM | POA: Diagnosis not present

## 2021-10-20 DIAGNOSIS — K200 Eosinophilic esophagitis: Secondary | ICD-10-CM | POA: Diagnosis not present

## 2021-10-20 DIAGNOSIS — K2289 Other specified disease of esophagus: Secondary | ICD-10-CM | POA: Diagnosis not present

## 2021-10-20 DIAGNOSIS — K2930 Chronic superficial gastritis without bleeding: Secondary | ICD-10-CM | POA: Diagnosis not present

## 2021-10-20 DIAGNOSIS — B9681 Helicobacter pylori [H. pylori] as the cause of diseases classified elsewhere: Secondary | ICD-10-CM | POA: Diagnosis not present

## 2021-10-20 DIAGNOSIS — K2950 Unspecified chronic gastritis without bleeding: Secondary | ICD-10-CM | POA: Diagnosis not present

## 2021-11-23 DIAGNOSIS — I10 Essential (primary) hypertension: Secondary | ICD-10-CM | POA: Diagnosis not present

## 2021-11-23 DIAGNOSIS — Z23 Encounter for immunization: Secondary | ICD-10-CM | POA: Diagnosis not present

## 2022-01-07 DIAGNOSIS — B9681 Helicobacter pylori [H. pylori] as the cause of diseases classified elsewhere: Secondary | ICD-10-CM | POA: Diagnosis not present

## 2022-01-07 DIAGNOSIS — K200 Eosinophilic esophagitis: Secondary | ICD-10-CM | POA: Diagnosis not present

## 2022-05-14 DIAGNOSIS — M791 Myalgia, unspecified site: Secondary | ICD-10-CM | POA: Diagnosis not present

## 2022-05-14 DIAGNOSIS — E782 Mixed hyperlipidemia: Secondary | ICD-10-CM | POA: Diagnosis not present

## 2022-05-14 DIAGNOSIS — M25562 Pain in left knee: Secondary | ICD-10-CM | POA: Diagnosis not present

## 2022-05-14 DIAGNOSIS — I10 Essential (primary) hypertension: Secondary | ICD-10-CM | POA: Diagnosis not present

## 2022-05-14 DIAGNOSIS — T466X5D Adverse effect of antihyperlipidemic and antiarteriosclerotic drugs, subsequent encounter: Secondary | ICD-10-CM | POA: Diagnosis not present

## 2022-05-14 DIAGNOSIS — B9681 Helicobacter pylori [H. pylori] as the cause of diseases classified elsewhere: Secondary | ICD-10-CM | POA: Diagnosis not present

## 2022-05-14 DIAGNOSIS — Z0000 Encounter for general adult medical examination without abnormal findings: Secondary | ICD-10-CM | POA: Diagnosis not present

## 2022-05-14 DIAGNOSIS — Z125 Encounter for screening for malignant neoplasm of prostate: Secondary | ICD-10-CM | POA: Diagnosis not present

## 2022-05-14 DIAGNOSIS — K219 Gastro-esophageal reflux disease without esophagitis: Secondary | ICD-10-CM | POA: Diagnosis not present

## 2022-08-23 IMAGING — MR MR KNEE*L* W/O CM
4 of 6 series · 24 of 40 positions shown · non-contrast
Comparison: None.

CLINICAL DATA: Left knee pain for 1 year, worse over the past
month. History of an injury playing soccer.

EXAM:
MRI OF THE LEFT KNEE WITHOUT CONTRAST
TECHNIQUE: Multiplanar, multisequence MR imaging of the knee was performed. No
intravenous contrast was administered.

[Series 3: T2 fat-sat · axial · 4.0mm · 0.62mm/px · z∈[-110,+39]mm · 8 of 31 slices shown (1 of 2)]
[im 1/31]
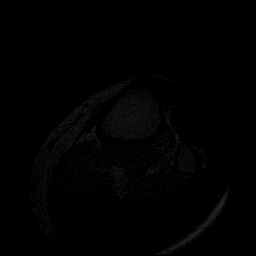
[im 5/31]
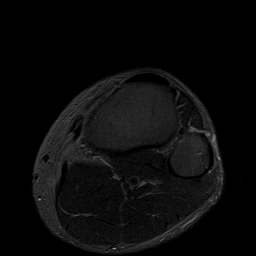
[im 9/31]
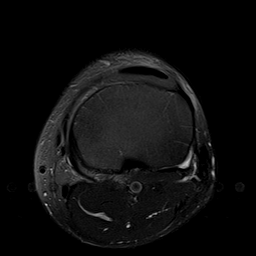
[im 13/31]
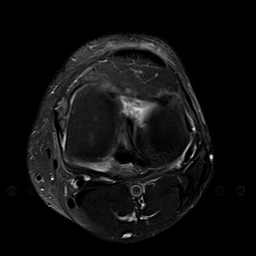
[im 18/31]
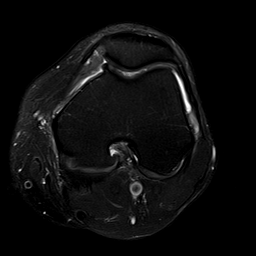
[im 22/31]
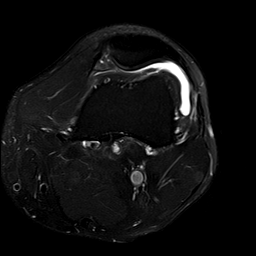
[im 26/31]
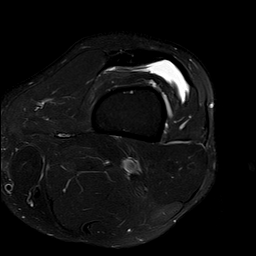
[im 31/31]
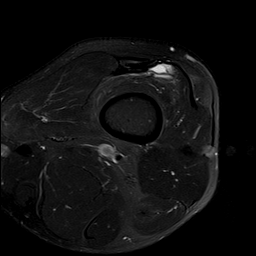

[Series 5: T2 fat-sat · coronal · 4.0mm · 0.29mm/px · 3 of 27 slices shown (2 of 2)]
[im 6/27]
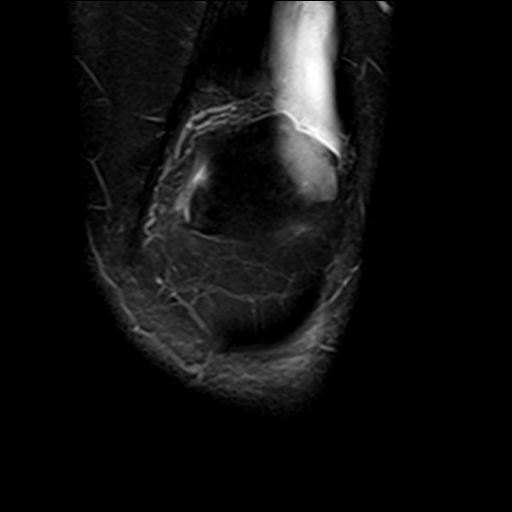
[im 16/27]
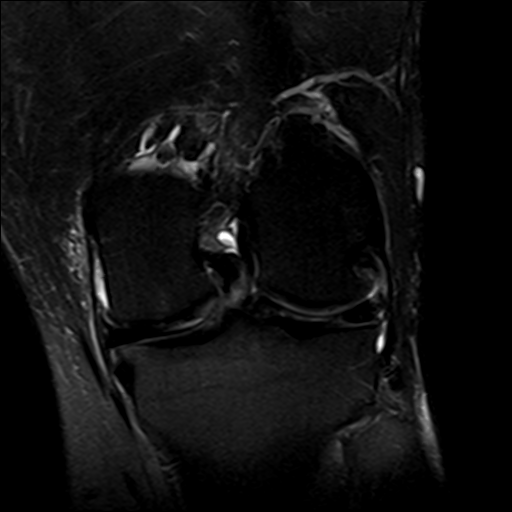
[im 27/27]
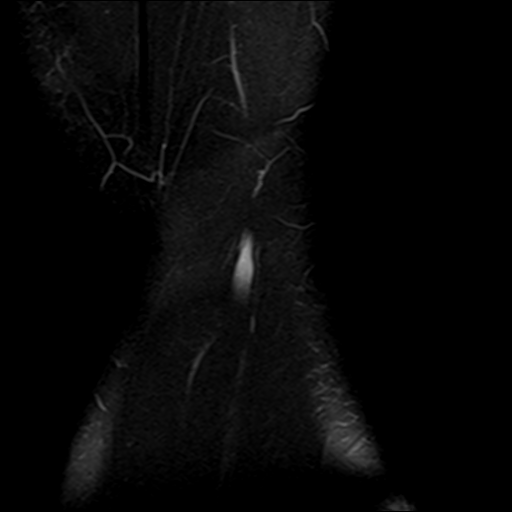

[Series 7: PD fat-sat · sagittal · 3.0mm · 0.29mm/px · 7 of 29 slices shown (1 of 2)]
[im 1/29]
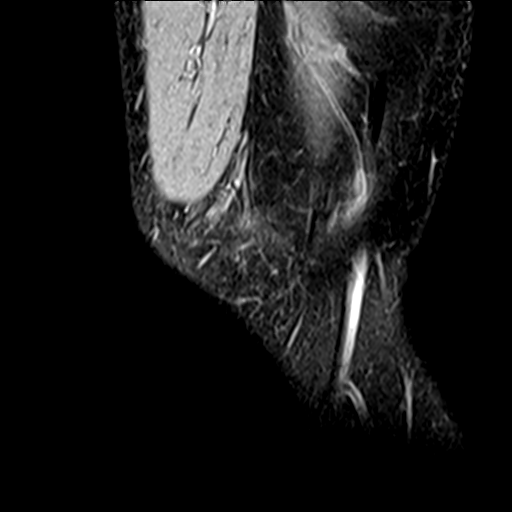
[im 5/29]
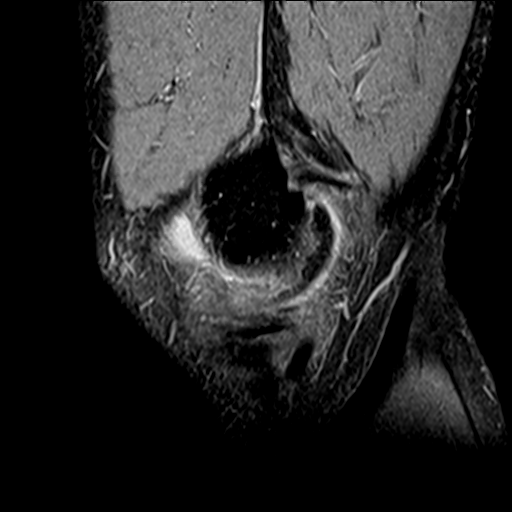
[im 10/29]
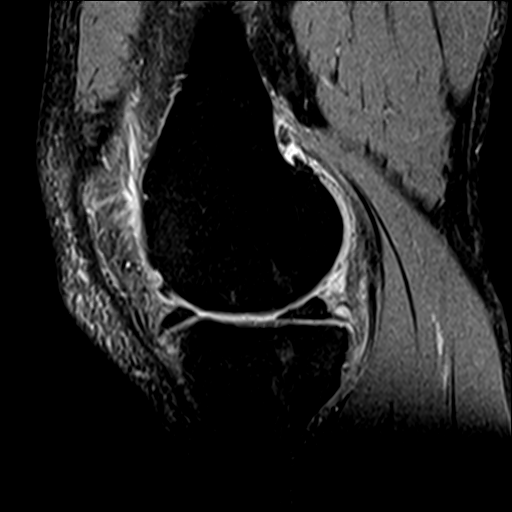
[im 15/29]
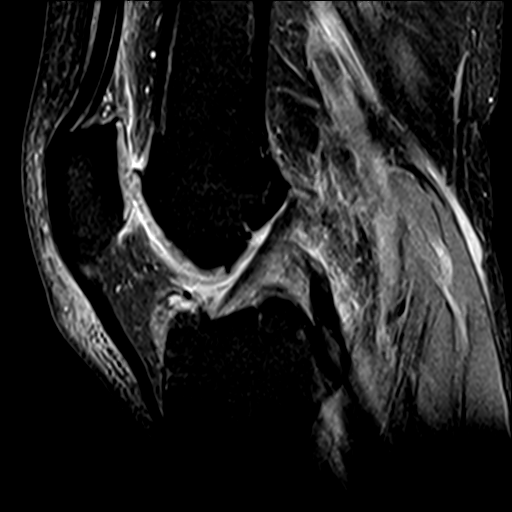
[im 19/29]
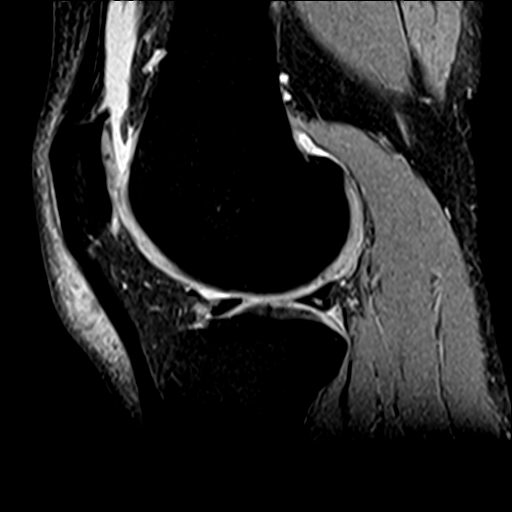
[im 24/29]
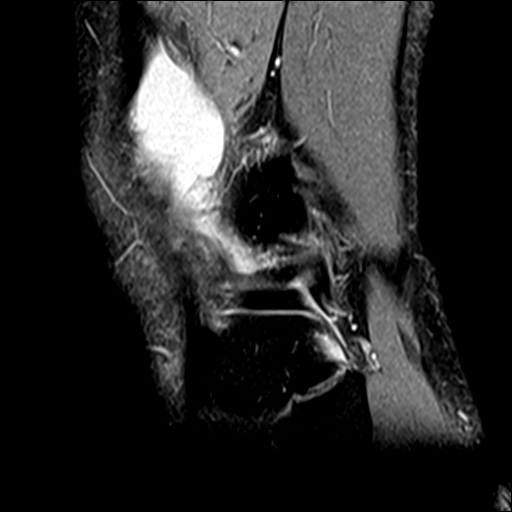
[im 29/29]
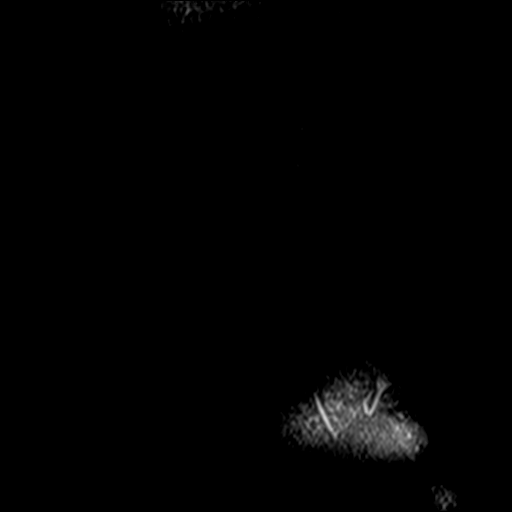

[Series 8: PD fat-sat · coronal · 3.0mm · 0.29mm/px · 6 of 26 slices shown (2 of 2)]
[im 1/26]
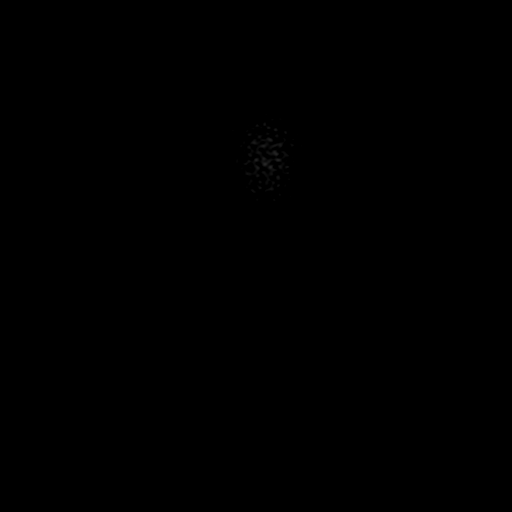
[im 6/26]
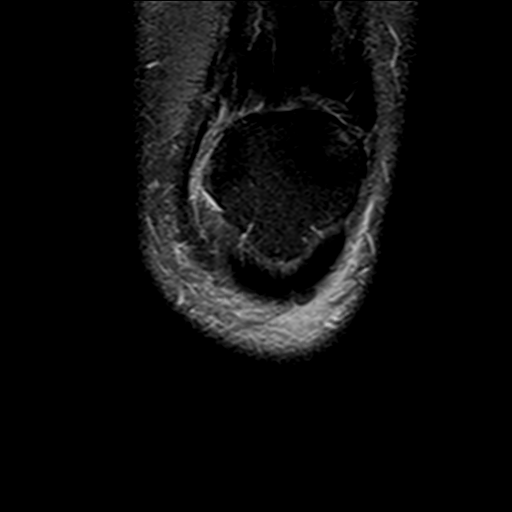
[im 11/26]
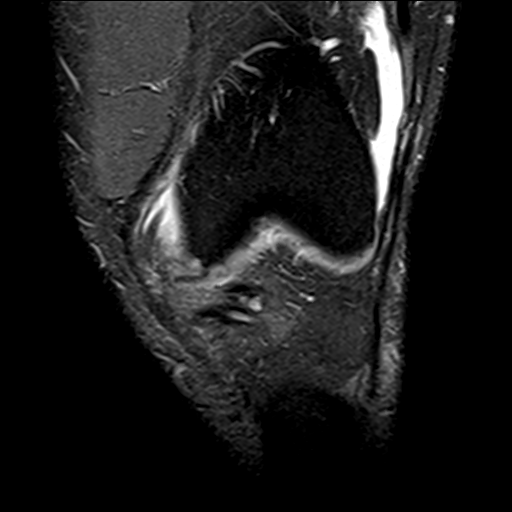
[im 16/26]
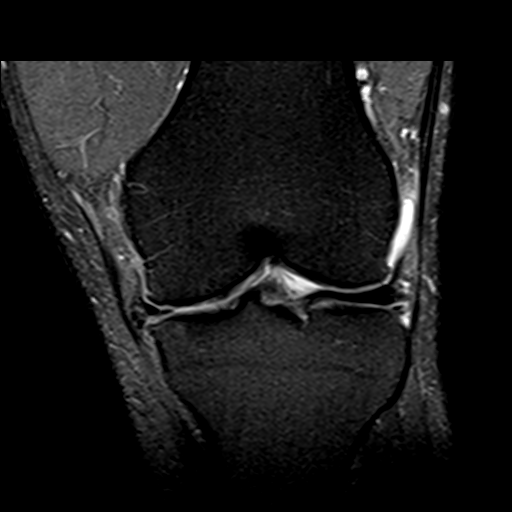
[im 21/26]
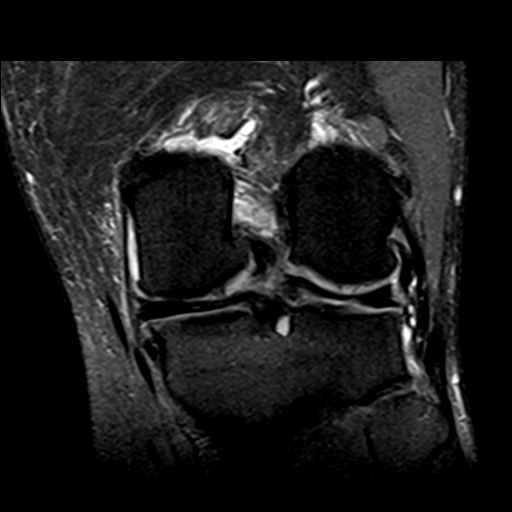
[im 26/26]
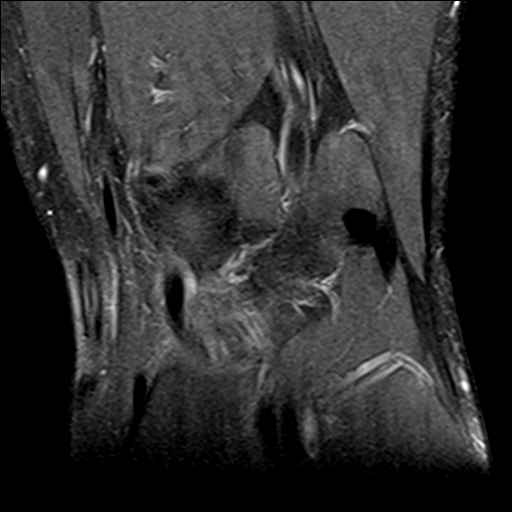

[24 of 40 positions shown; findings below may reference images not displayed]

FINDINGS: MENISCI

Medial meniscus: The body is degenerated with a horizontal tear
reaching the meniscal undersurface.

Lateral meniscus: Focal blunting along the free edge of the
posterior body seen on image 7 of series 8 is compatible with a
small radial tear.

LIGAMENTS

Cruciates:  Intact.

Collaterals:  Intact.

CARTILAGE

Patellofemoral: Mildly to moderately frayed and irregular without
focal defect.

Medial: Thinning is most notable along the weight-bearing medial
femoral condyle.

Lateral:  Mildly degenerated.

Joint:  Small to moderate joint effusion.

Popliteal Fossa:  No Baker's cyst.

Extensor Mechanism:  Intact.

Bones: No fracture, contusion or focal lesion. Tricompartmental
osteophytosis noted.

Other: None.
IMPRESSION: Degenerated body of the medial meniscus with a horizontal tear
reaching the meniscal undersurface.

Small, focal radial tear along the free edge of the posterior body
of the lateral meniscus.

Mild-to-moderate osteoarthritis which appears worst in the
patellofemoral and medial compartments.

## 2022-09-29 NOTE — Progress Notes (Signed)
Cardiology Office Note:   Date:  10/01/2022  NAME:  Gregory Torres    MRN: [id number] DOB:  [birth year]   PCP:  Soundra Pilon, FNP  Cardiologist:  None  Electrophysiologist:  None   Referring MD: Soundra Pilon, FNP   Chief Complaint  Patient presents with   Follow-up         History of Present Illness:   Gregory Torres is a 57 y.o. male with a hx of CAD who presents for follow-up.  He reports he had left knee pain with the statin.  Unable to take this.  Now on Zetia.  Has been on this since March.  LDL cholesterol 183 in March.  Reports occasional muscle pain in his left and right chest.  Appears to be noncardiac.  EKG is normal.  Blood pressure is elevated today 134/100.  He tells me values between 140-150 at home.  Not controlled.  On amlodipine 10 and carvedilol 3.25 mg twice daily.  We discussed increasing his carvedilol.  He does occasionally eat salty meals.  He is still exercising and staying active.  No significant symptoms concerning for angina.  Not diabetic.  No other updates to medical history.  Problem list 1.  Hypertension 2.  Hyperlipidemia - T chol 251, HDL 45, LDL 183, TG 129 3. CAD -25-49% LCX/OM -CAC 161 (88th percentile)  Past Medical History: Past Medical History:  Diagnosis Date   GERD (gastroesophageal reflux disease)    Hypertension     Past Surgical History: Past Surgical History:  Procedure Laterality Date   KNEE ARTHROSCOPY      Current Medications: Current Meds  Medication Sig   amLODipine (NORVASC) 10 MG tablet Take 1 tablet (10 mg total) by mouth daily.   carvedilol (COREG) 12.5 MG tablet Take 1 tablet (12.5 mg total) by mouth 2 (two) times daily.   escitalopram (LEXAPRO) 5 MG tablet Take 5 mg by mouth daily.   ezetimibe (ZETIA) 10 MG tablet Take 10 mg by mouth daily.   pantoprazole (PROTONIX) 40 MG tablet Take 40 mg by mouth daily.   [DISCONTINUED] carvedilol (COREG) 3.125 MG tablet Take 3.125 mg by mouth 2 (two) times daily with a  meal.     Allergies:    Patient has no known allergies.   Social History: Social History   Socioeconomic History   Marital status: Married    Spouse name: Not on file   Number of children: 1   Years of education: Not on file   Highest education level: Not on file  Occupational History   Occupation: Warehouse management  Tobacco Use   Smoking status: Never   Smokeless tobacco: Never  Substance and Sexual Activity   Alcohol use: Yes   Drug use: Never   Sexual activity: Not on file  Other Topics Concern   Not on file  Social History Narrative   Not on file   Social Determinants of Health   Financial Resource Strain: Not on file  Food Insecurity: Not on file  Transportation Needs: Not on file  Physical Activity: Not on file  Stress: Not on file  Social Connections: Not on file     Family History: The patient's family history includes Heart disease in his father; Hyperlipidemia in his brother.  ROS:   All other ROS reviewed and negative. Pertinent positives noted in the HPI.     EKGs/Labs/Other Studies Reviewed:   The following studies were personally reviewed by me today:  EKG:  EKG is ordered  today.    EKG Interpretation Date/Time:  Friday October 01 2022 08:02:41 EDT Ventricular Rate:  60 PR Interval:  190 QRS Duration:  96 QT Interval:  414 QTC Calculation: 414 R Axis:   13  Text Interpretation: Normal sinus rhythm Normal ECG Confirmed by Lennie Odor [phone]) on 10/01/2022 8:03:50 AM   Recent Labs: No results found for requested labs within last 365 days.   Recent Lipid Panel No results found for: "CHOL", "TRIG", "HDL", "CHOLHDL", "VLDL", "LDLCALC", "LDLDIRECT"  Physical Exam:   VS:  BP (!) 134/100 (BP Location: Right Arm, Patient Position: Sitting, Cuff Size: Normal)   Pulse 60   Ht 5\' 7"  (1.702 m)   Wt 202 lb 6.4 oz (91.8 kg)   SpO2 97%   BMI 31.70 kg/m    Wt Readings from Last 3 Encounters:  10/01/22 202 lb 6.4 oz (91.8 kg)  10/03/20 199  lb 9.6 oz (90.5 kg)  01/31/19 200 lb (90.7 kg)    General: Well nourished, well developed, in no acute distress Head: Atraumatic, normal size  Eyes: PEERLA, EOMI  Neck: Supple, no JVD Endocrine: No thryomegaly Cardiac: Normal S1, S2; RRR; no murmurs, rubs, or gallops Lungs: Clear to auscultation bilaterally, no wheezing, rhonchi or rales  Abd: Soft, nontender, no hepatomegaly  Ext: No edema, pulses 2+ Musculoskeletal: No deformities, BUE and BLE strength normal and equal Skin: Warm and dry, no rashes   Neuro: Alert and oriented to person, place, time, and situation, CNII-XII grossly intact, no focal deficits  Psych: Normal mood and affect   ASSESSMENT:   Gregory Torres is a 57 y.o. male who presents for the following: 1. Coronary artery disease involving native coronary artery of native heart without angina pectoris   2. Agatston coronary artery calcium score between 100 and 199   3. Mixed hyperlipidemia   4. Primary hypertension     PLAN:   1. Coronary artery disease involving native coronary artery of native heart without angina pectoris 2. Agatston coronary artery calcium score between 100 and 199 3. Mixed hyperlipidemia -Nonobstructive CAD on coronary CTA 2 years ago.  Need to get his lipids under better control.  His coronary calcium score is less than 300.  I think the better option is to get his lipids under good control.  I do not feel strongly about aspirin.  He is intolerant of statins.  On Zetia 10 mg daily.  Recheck lipids.  If lipids are not at goal he needs referral to pharmacy for PCSK9 inhibitor therapy.  Will see where his lipids are.  See discussion on blood pressure below.  4. Primary hypertension -Not at goal.  Continue amlodipine 10 mg daily.  Increase carvedilol to 12.5 mg twice daily.  Check blood pressure twice daily.  He will keep a log.  Will give him information on salt reduction.  He will see me back in 6 months to discuss further.  I did inform him that his  blood pressure goal is less than 130/80.  If values are persistently above this he can call back for reevaluation sooner than 6 months.      Disposition: Return in about 6 months (around 04/03/2023).  Medication Adjustments/Labs and Tests Ordered: Current medicines are reviewed at length with the patient today.  Concerns regarding medicines are outlined above.  Orders Placed This Encounter  Procedures   Lipid panel   EKG 12-Lead   Meds ordered this encounter  Medications   carvedilol (COREG) 12.5 MG tablet  Sig: Take 1 tablet (12.5 mg total) by mouth 2 (two) times daily.    Dispense:  180 tablet    Refill:  3   Patient Instructions  Medication Instructions:  INCREASE CARVEDILOL TO 12.5 MG TWICE DAILY *If you need a refill on your cardiac medications before your next appointment, please call your pharmacy*   Lab Work: FASTING LIPID If you have labs (blood work) drawn today and your tests are completely normal, you will receive your results only by: MyChart Message (if you have MyChart) OR A paper copy in the mail If you have any lab test that is abnormal or we need to change your treatment, we will call you to review the results.   Testing/Procedures: NONE   Follow-Up: At [facility], you and your health needs are our priority.  As part of our continuing mission to provide you with exceptional heart care, we have created designated Provider Care Teams.  These Care Teams include your primary Cardiologist (physician) and Advanced Practice Providers (APPs -  Physician Assistants and Nurse Practitioners) who all work together to provide you with the care you need, when you need it.  We recommend signing up for the patient portal called "MyChart".  Sign up information is provided on this After Visit Summary.  MyChart is used to connect with patients for Virtual Visits (Telemedicine).  Patients are able to view lab/test results, encounter notes, upcoming appointments,  etc.  Non-urgent messages can be sent to your provider as well.   To learn more about what you can do with MyChart, go to [email or web address].    Your next appointment:   6 month(s)  Provider:   DR Wynelle Beckmann B/P TWICE DAILY   Low-Sodium Eating Plan Salt (sodium) helps you keep a healthy balance of fluids in your body. Too much sodium can raise your blood pressure. It can also cause fluid and waste to be held in your body. Your health care provider or dietitian may recommend a low-sodium eating plan if you have high blood pressure (hypertension), kidney disease, liver disease, or heart failure. Eating less sodium can help lower your blood pressure and reduce swelling. It can also protect your heart, liver, and kidneys. What are tips for following this plan? Reading food labels  Check food labels for the amount of sodium per serving. If you eat more than one serving, you must multiply the listed amount by the number of servings. Choose foods with less than 140 milligrams (mg) of sodium per serving. Avoid foods with 300 mg of sodium or more per serving. Always check how much sodium is in a product, even if the label says "unsalted" or "no salt added." Shopping  Buy products labeled as "low-sodium" or "no salt added." Buy fresh foods. Avoid canned foods and pre-made or frozen meals. Avoid canned, cured, or processed meats. Buy breads that have less than 80 mg of sodium per slice. Cooking  Eat more home-cooked food. Try to eat less restaurant, buffet, and fast food. Try not to add salt when you cook. Use salt-free seasonings or herbs instead of table salt or sea salt. Check with your provider or pharmacist before using salt substitutes. Cook with plant-based oils, such as canola, sunflower, or olive oil. Meal planning When eating at a restaurant, ask if your food can be made with less salt or no salt. Avoid dishes labeled as brined, pickled, cured, or smoked. Avoid dishes made  with soy sauce, miso, or teriyaki sauce.  Avoid foods that have monosodium glutamate (MSG) in them. MSG may be added to some restaurant food, sauces, soups, bouillon, and canned foods. Make meals that can be grilled, baked, poached, roasted, or steamed. These are often made with less sodium. General information Try to limit your sodium intake to 1,500-2,300 mg each day, or the amount told by your provider. What foods should I eat? Fruits Fresh, frozen, or canned fruit. Fruit juice. Vegetables Fresh or frozen vegetables. "No salt added" canned vegetables. "No salt added" tomato sauce and paste. Low-sodium or reduced-sodium tomato and vegetable juice. Grains Low-sodium cereals, such as oats, puffed wheat and rice, and shredded wheat. Low-sodium crackers. Unsalted rice. Unsalted pasta. Low-sodium bread. Whole grain breads and whole grain pasta. Meats and other proteins Fresh or frozen meat, poultry, seafood, and fish. These should have no added salt. Low-sodium canned tuna and salmon. Unsalted nuts. Dried peas, beans, and lentils without added salt. Unsalted canned beans. Eggs. Unsalted nut butters. Dairy Milk. Soy milk. Cheese that is naturally low in sodium, such as ricotta cheese, fresh mozzarella, or Swiss cheese. Low-sodium or reduced-sodium cheese. Cream cheese. Yogurt. Seasonings and condiments Fresh and dried herbs and spices. Salt-free seasonings. Low-sodium mustard and ketchup. Sodium-free salad dressing. Sodium-free light mayonnaise. Fresh or refrigerated horseradish. Lemon juice. Vinegar. Other foods Homemade, reduced-sodium, or low-sodium soups. Unsalted popcorn and pretzels. Low-salt or salt-free chips. The items listed above may not be all the foods and drinks you can have. Talk to a dietitian to learn more. What foods should I avoid? Vegetables Sauerkraut, pickled vegetables, and relishes. Olives. [place] fries. Onion rings. Regular canned vegetables, except low-sodium or  reduced-sodium items. Regular canned tomato sauce and paste. Regular tomato and vegetable juice. Frozen vegetables in sauces. Grains Instant hot cereals. Bread stuffing, pancake, and biscuit mixes. Croutons. Seasoned rice or pasta mixes. Noodle soup cups. Boxed or frozen macaroni and cheese. Regular salted crackers. Self-rising flour. Meats and other proteins Meat or fish that is salted, canned, smoked, spiced, or pickled. Precooked or cured meat, such as sausages or meat loaves. Tomasa Blase. Ham. Pepperoni. Hot dogs. Corned beef. Chipped beef. Salt pork. Jerky. Pickled herring, anchovies, and sardines. Regular canned tuna. Salted nuts. Dairy Processed cheese and cheese spreads. Hard cheeses. Cheese curds. Blue cheese. Feta cheese. String cheese. Regular cottage cheese. Buttermilk. Canned milk. Fats and oils Salted butter. Regular margarine. Ghee. Bacon fat. Seasonings and condiments Onion salt, garlic salt, seasoned salt, table salt, and sea salt. Canned and packaged gravies. Worcestershire sauce. Tartar sauce. Barbecue sauce. Teriyaki sauce. Soy sauce, including reduced-sodium soy sauce. Steak sauce. Fish sauce. Oyster sauce. Cocktail sauce. Horseradish that you find on the shelf. Regular ketchup and mustard. Meat flavorings and tenderizers. Bouillon cubes. Hot sauce. Pre-made or packaged marinades. Pre-made or packaged taco seasonings. Relishes. Regular salad dressings. Salsa. Other foods Salted popcorn and pretzels. Corn chips and puffs. Potato and tortilla chips. Canned or dried soups. Pizza. Frozen entrees and pot pies. The items listed above may not be all the foods and drinks you should avoid. Talk to a dietitian to learn more. This information is not intended to replace advice given to you by your health care provider. Make sure you discuss any questions you have with your health care provider. Document Revised: 03/04/2022 Document Reviewed: 03/04/2022 Elsevier Patient Education  2024 Elsevier  Inc.    Time Spent with Patient: I have spent a total of 35 minutes with patient reviewing hospital notes, telemetry, EKGs, labs and examining the patient as well as establishing  an assessment and plan that was discussed with the patient.  > 50% of time was spent in direct patient care.  Signed, Lenna Gilford. Flora Lipps, MD, [facility]  [facility]  [address], [address] [place], [place] [address] [phone]  10/01/2022 9:23 AM

## 2022-10-01 ENCOUNTER — Ambulatory Visit: Payer: BC Managed Care – PPO | Admitting: Cardiovascular Disease

## 2022-10-01 ENCOUNTER — Encounter: Payer: Self-pay | Admitting: Cardiovascular Disease

## 2022-10-01 ENCOUNTER — Other Ambulatory Visit: Payer: Self-pay

## 2022-10-01 VITALS — BP 134/100 | HR 60 | Ht 67.0 in | Wt 202.4 lb

## 2022-10-01 DIAGNOSIS — E782 Mixed hyperlipidemia: Secondary | ICD-10-CM | POA: Diagnosis not present

## 2022-10-01 DIAGNOSIS — I2510 Atherosclerotic heart disease of native coronary artery without angina pectoris: Secondary | ICD-10-CM | POA: Diagnosis not present

## 2022-10-01 DIAGNOSIS — R931 Abnormal findings on diagnostic imaging of heart and coronary circulation: Secondary | ICD-10-CM

## 2022-10-01 DIAGNOSIS — I10 Essential (primary) hypertension: Secondary | ICD-10-CM | POA: Diagnosis not present

## 2022-10-01 MED ORDER — CARVEDILOL 12.5 MG PO TABS: 12.5000 mg | ORAL_TABLET | Freq: Two times a day (BID) | ORAL | 3 refills | Status: DC

## 2022-10-01 NOTE — Patient Instructions (Addendum)
Medication Instructions:  INCREASE CARVEDILOL TO 12.5 MG TWICE DAILY *If you need a refill on your cardiac medications before your next appointment, please call your pharmacy*   Lab Work: FASTING LIPID If you have labs (blood work) drawn today and your tests are completely normal, you will receive your results only by: MyChart Message (if you have MyChart) OR A paper copy in the mail If you have any lab test that is abnormal or we need to change your treatment, we will call you to review the results.   Testing/Procedures: NONE   Follow-Up: At [facility], you and your health needs are our priority.  As part of our continuing mission to provide you with exceptional heart care, we have created designated Provider Care Teams.  These Care Teams include your primary Cardiologist (physician) and Advanced Practice Providers (APPs -  Physician Assistants and Nurse Practitioners) who all work together to provide you with the care you need, when you need it.  We recommend signing up for the patient portal called "MyChart".  Sign up information is provided on this After Visit Summary.  MyChart is used to connect with patients for Virtual Visits (Telemedicine).  Patients are able to view lab/test results, encounter notes, upcoming appointments, etc.  Non-urgent messages can be sent to your provider as well.   To learn more about what you can do with MyChart, go to [email or web address].    Your next appointment:   6 month(s)  Provider:   DR Wynelle Beckmann B/P TWICE DAILY   Low-Sodium Eating Plan Salt (sodium) helps you keep a healthy balance of fluids in your body. Too much sodium can raise your blood pressure. It can also cause fluid and waste to be held in your body. Your health care provider or dietitian may recommend a low-sodium eating plan if you have high blood pressure (hypertension), kidney disease, liver disease, or heart failure. Eating less sodium can help lower your  blood pressure and reduce swelling. It can also protect your heart, liver, and kidneys. What are tips for following this plan? Reading food labels  Check food labels for the amount of sodium per serving. If you eat more than one serving, you must multiply the listed amount by the number of servings. Choose foods with less than 140 milligrams (mg) of sodium per serving. Avoid foods with 300 mg of sodium or more per serving. Always check how much sodium is in a product, even if the label says "unsalted" or "no salt added." Shopping  Buy products labeled as "low-sodium" or "no salt added." Buy fresh foods. Avoid canned foods and pre-made or frozen meals. Avoid canned, cured, or processed meats. Buy breads that have less than 80 mg of sodium per slice. Cooking  Eat more home-cooked food. Try to eat less restaurant, buffet, and fast food. Try not to add salt when you cook. Use salt-free seasonings or herbs instead of table salt or sea salt. Check with your provider or pharmacist before using salt substitutes. Cook with plant-based oils, such as canola, sunflower, or olive oil. Meal planning When eating at a restaurant, ask if your food can be made with less salt or no salt. Avoid dishes labeled as brined, pickled, cured, or smoked. Avoid dishes made with soy sauce, miso, or teriyaki sauce. Avoid foods that have monosodium glutamate (MSG) in them. MSG may be added to some restaurant food, sauces, soups, bouillon, and canned foods. Make meals that can be grilled, baked, poached, roasted,  or steamed. These are often made with less sodium. General information Try to limit your sodium intake to 1,500-2,300 mg each day, or the amount told by your provider. What foods should I eat? Fruits Fresh, frozen, or canned fruit. Fruit juice. Vegetables Fresh or frozen vegetables. "No salt added" canned vegetables. "No salt added" tomato sauce and paste. Low-sodium or reduced-sodium tomato and vegetable  juice. Grains Low-sodium cereals, such as oats, puffed wheat and rice, and shredded wheat. Low-sodium crackers. Unsalted rice. Unsalted pasta. Low-sodium bread. Whole grain breads and whole grain pasta. Meats and other proteins Fresh or frozen meat, poultry, seafood, and fish. These should have no added salt. Low-sodium canned tuna and salmon. Unsalted nuts. Dried peas, beans, and lentils without added salt. Unsalted canned beans. Eggs. Unsalted nut butters. Dairy Milk. Soy milk. Cheese that is naturally low in sodium, such as ricotta cheese, fresh mozzarella, or Swiss cheese. Low-sodium or reduced-sodium cheese. Cream cheese. Yogurt. Seasonings and condiments Fresh and dried herbs and spices. Salt-free seasonings. Low-sodium mustard and ketchup. Sodium-free salad dressing. Sodium-free light mayonnaise. Fresh or refrigerated horseradish. Lemon juice. Vinegar. Other foods Homemade, reduced-sodium, or low-sodium soups. Unsalted popcorn and pretzels. Low-salt or salt-free chips. The items listed above may not be all the foods and drinks you can have. Talk to a dietitian to learn more. What foods should I avoid? Vegetables Sauerkraut, pickled vegetables, and relishes. Olives. [place] fries. Onion rings. Regular canned vegetables, except low-sodium or reduced-sodium items. Regular canned tomato sauce and paste. Regular tomato and vegetable juice. Frozen vegetables in sauces. Grains Instant hot cereals. Bread stuffing, pancake, and biscuit mixes. Croutons. Seasoned rice or pasta mixes. Noodle soup cups. Boxed or frozen macaroni and cheese. Regular salted crackers. Self-rising flour. Meats and other proteins Meat or fish that is salted, canned, smoked, spiced, or pickled. Precooked or cured meat, such as sausages or meat loaves. Gregory Torres. Ham. Pepperoni. Hot dogs. Corned beef. Chipped beef. Salt pork. Jerky. Pickled herring, anchovies, and sardines. Regular canned tuna. Salted nuts. Dairy Processed cheese  and cheese spreads. Hard cheeses. Cheese curds. Blue cheese. Feta cheese. String cheese. Regular cottage cheese. Buttermilk. Canned milk. Fats and oils Salted butter. Regular margarine. Ghee. Bacon fat. Seasonings and condiments Onion salt, garlic salt, seasoned salt, table salt, and sea salt. Canned and packaged gravies. Worcestershire sauce. Tartar sauce. Barbecue sauce. Teriyaki sauce. Soy sauce, including reduced-sodium soy sauce. Steak sauce. Fish sauce. Oyster sauce. Cocktail sauce. Horseradish that you find on the shelf. Regular ketchup and mustard. Meat flavorings and tenderizers. Bouillon cubes. Hot sauce. Pre-made or packaged marinades. Pre-made or packaged taco seasonings. Relishes. Regular salad dressings. Salsa. Other foods Salted popcorn and pretzels. Corn chips and puffs. Potato and tortilla chips. Canned or dried soups. Pizza. Frozen entrees and pot pies. The items listed above may not be all the foods and drinks you should avoid. Talk to a dietitian to learn more. This information is not intended to replace advice given to you by your health care provider. Make sure you discuss any questions you have with your health care provider. Document Revised: 03/04/2022 Document Reviewed: 03/04/2022 Elsevier Patient Education  2024 [employer].

## 2022-10-02 LAB — LIPID PANEL
Chol/HDL Ratio: 4.4 ratio (ref 0.0–5.0)
Cholesterol, Total: 194 mg/dL (ref 100–199)
HDL: 44 mg/dL
LDL Chol Calc (NIH): 132 mg/dL — ABNORMAL HIGH (ref 0–99)
Triglycerides: 100 mg/dL (ref 0–149)
VLDL Cholesterol Cal: 18 mg/dL (ref 5–40)

## 2022-10-06 ENCOUNTER — Other Ambulatory Visit: Payer: Self-pay | Admitting: *Deleted

## 2022-10-06 DIAGNOSIS — E782 Mixed hyperlipidemia: Secondary | ICD-10-CM

## 2022-10-06 DIAGNOSIS — R931 Abnormal findings on diagnostic imaging of heart and coronary circulation: Secondary | ICD-10-CM

## 2022-10-08 ENCOUNTER — Encounter: Payer: Self-pay | Admitting: Pharmacist

## 2022-10-08 ENCOUNTER — Other Ambulatory Visit (HOSPITAL_COMMUNITY): Payer: Self-pay

## 2022-10-08 ENCOUNTER — Telehealth: Payer: Self-pay | Admitting: Pharmacist

## 2022-10-08 ENCOUNTER — Ambulatory Visit: Payer: BC Managed Care – PPO | Attending: Cardiology | Admitting: Pharmacist

## 2022-10-08 DIAGNOSIS — M791 Myalgia, unspecified site: Secondary | ICD-10-CM

## 2022-10-08 DIAGNOSIS — E782 Mixed hyperlipidemia: Secondary | ICD-10-CM

## 2022-10-08 DIAGNOSIS — I2510 Atherosclerotic heart disease of native coronary artery without angina pectoris: Secondary | ICD-10-CM | POA: Diagnosis not present

## 2022-10-08 DIAGNOSIS — T466X5A Adverse effect of antihyperlipidemic and antiarteriosclerotic drugs, initial encounter: Secondary | ICD-10-CM | POA: Insufficient documentation

## 2022-10-08 DIAGNOSIS — R931 Abnormal findings on diagnostic imaging of heart and coronary circulation: Secondary | ICD-10-CM | POA: Diagnosis not present

## 2022-10-08 NOTE — Telephone Encounter (Signed)
Attempted to complete Repatha PA.  Key: [id number].  Result: [employer] does not handle this review. Please contact [employer] [place] [facility] [place] at [phone].

## 2022-10-08 NOTE — Patient Instructions (Addendum)
It was nice meeting you today  We would like your LDL (bad cholesterol) to be less than 70  Please continue your Zetia 10mg  once a day  The medication we discussed today is called Repatha which is an injection you would take once every 2 weeks  I will complete the prior authorization for you and send you a message when approved  Once you start the medication we would like to recheck your fasting lipid panel in 2-3 months  Please message or call with any questions  Laural Golden, PharmD, BCACP, CDCES, CPP [address], [address] [place], [place], [address] Phone: [phone], Fax: [phone]

## 2022-10-08 NOTE — Progress Notes (Signed)
Patient ID: Gregory Torres                 DOB: Oct 14, 1965                    MRN: [id number]     HPI: Gregory Torres is a 57 y.o. male patient referred to lipid clinic by Dr Flora Lipps. [facility] is significant for HLD, CAD, HTN, elevated coronary calcium score, and statin intolerance.  Patient presents today to discuss lipid management. Has been working on his diet. Has stopped eating "junk" he says and is avoiding fast foods. Is trying to avoid processed foods and sticking to proteins and vegetables.   Is very active. Plays soccer and golf regularly. Going to golf tournament today. Now that he has discontinued his statin he is able to be more physically active again. 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 Wife works for a company that does Counselling psychologist for [employer].  Having no reported adverse effects to Zetia.  Current Medications:  Zetia 10mg  daily  Intolerances:  Statins  Risk Factors:  CAD Elevated Coronary Calcium score  LDL goal: <70  Labs: TC 194, Trigs 100, HDL 44, LDL 132 (10/01/22)  Imaging:  1. Coronary calcium score of 161. This was 88th percentile for age and sex matched control.   2.  Normal coronary origin with right dominance.   3.  Mild atherosclerosis.  CAD RADS 2.  Past Medical History:  Diagnosis Date   GERD (gastroesophageal reflux disease)    Hypertension     Current Outpatient Medications on File Prior to Visit  Medication Sig Dispense Refill   amLODipine (NORVASC) 10 MG tablet Take 1 tablet (10 mg total) by mouth daily. 90 tablet 03   carvedilol (COREG) 12.5 MG tablet Take 1 tablet (12.5 mg total) by mouth 2 (two) times daily. 180 tablet 3   escitalopram (LEXAPRO) 5 MG tablet Take 5 mg by mouth daily.     ezetimibe (ZETIA) 10 MG tablet Take 10 mg by mouth daily.     hydrochlorothiazide (HYDRODIURIL) 25 MG tablet Take 25 mg by mouth every morning.     meloxicam (MOBIC) 15 MG tablet TAKE 1 TABLET BY MOUTH EVERY DAY 30  tablet 0   omeprazole (PRILOSEC) 10 MG capsule Take by mouth.     pantoprazole (PROTONIX) 40 MG tablet Take 40 mg by mouth daily.     predniSONE (DELTASONE) 50 MG tablet Take 1 tablet daily with food for 5 days until finished 5 tablet 0   rosuvastatin (CRESTOR) 20 MG tablet Take 1 tablet (20 mg total) by mouth daily. 90 tablet 3   No current facility-administered medications on file prior to visit.    No Known Allergies  Assessment/Plan:  1. Hyperlipidemia - Patient LDL 132 which is above goal of <70. Due to intolerance of statins, recommend starting PCSK9i. Using demo pen educated patient on mechanism of action, storage, site selection, administration, and possible adverse effects. Will complete PA and contact patient when approved. Showed patient how to download copay card. Recheck lipid panel in 2-3 months. Patient voiced understanding.  Continue Zetia 10mg  daily Start Repatha 140mg  q 2 weeks Recheck lipid panel in 2-3 months Follow up as needed  Laural Golden, PharmD, BCACP, CDCES, CPP [address], [address] [place], [place], [address] Phone: [phone], Fax: [phone]

## 2022-10-15 ENCOUNTER — Other Ambulatory Visit (HOSPITAL_COMMUNITY): Payer: Self-pay

## 2022-10-18 ENCOUNTER — Other Ambulatory Visit (HOSPITAL_COMMUNITY): Payer: Self-pay

## 2022-10-18 NOTE — Telephone Encounter (Addendum)
Attempted to call plan for update on status or to initiate PA if still needed.   Plan says call center is closed temporarily due to an employee training session.   Will attempt to call again tomorrow.

## 2022-10-19 ENCOUNTER — Encounter: Payer: Self-pay | Admitting: Pharmacist

## 2022-10-20 ENCOUNTER — Other Ambulatory Visit (HOSPITAL_COMMUNITY): Payer: Self-pay

## 2022-10-20 ENCOUNTER — Telehealth: Payer: Self-pay

## 2022-10-20 DIAGNOSIS — E782 Mixed hyperlipidemia: Secondary | ICD-10-CM

## 2022-10-20 DIAGNOSIS — R931 Abnormal findings on diagnostic imaging of heart and coronary circulation: Secondary | ICD-10-CM

## 2022-10-20 DIAGNOSIS — I2510 Atherosclerotic heart disease of native coronary artery without angina pectoris: Secondary | ICD-10-CM

## 2022-10-20 MED ORDER — REPATHA SURECLICK 140 MG/ML ~~LOC~~ SOAJ
1.0000 mL | SUBCUTANEOUS | 11 refills | Status: DC
  Filled 2022-10-20: qty 2, 28d supply, fill #0
  Filled 2022-11-20: qty 2, 28d supply, fill #1
  Filled 2022-12-11: qty 2, 28d supply, fill #2
  Filled 2023-01-10: qty 2, 28d supply, fill #3
  Filled 2023-02-07: qty 2, 28d supply, fill #4
  Filled 2023-03-02: qty 2, 28d supply, fill #5

## 2022-10-20 NOTE — Addendum Note (Signed)
Addended by: Cheree Ditto on: 10/20/2022 12:41 PM   Modules accepted: Orders

## 2022-10-20 NOTE — Telephone Encounter (Signed)
PA request has been Submitted. New Encounter created for follow up. For additional info see Pharmacy Prior Auth telephone encounter from 10/20/22.

## 2022-10-20 NOTE — Telephone Encounter (Signed)
Pharmacy Patient Advocate Encounter   Received notification from Physician's Office that prior authorization for REPATHA is required/requested.   Insurance verification completed.   The [place]  .   Per test claim: PA required; [place] via CoverMyMeds Key/confirmation #/[id number] Status is pending

## 2022-10-20 NOTE — Telephone Encounter (Signed)
Pharmacy Patient Advocate Encounter  [place] from [place]  that Prior Authorization for REPATHA has been APPROVED from 10/20/22 to 10/20/23. Ran test claim, Copay is $40. This test claim was processed through [facility] Pharmacy- copay amounts may vary at other pharmacies due to pharmacy/plan contracts, or as the patient moves through the different stages of their insurance plan.

## 2022-10-21 ENCOUNTER — Other Ambulatory Visit (HOSPITAL_COMMUNITY): Payer: Self-pay

## 2022-11-22 ENCOUNTER — Other Ambulatory Visit (HOSPITAL_COMMUNITY): Payer: Self-pay

## 2022-11-26 DIAGNOSIS — E782 Mixed hyperlipidemia: Secondary | ICD-10-CM | POA: Diagnosis not present

## 2022-11-26 DIAGNOSIS — I10 Essential (primary) hypertension: Secondary | ICD-10-CM | POA: Diagnosis not present

## 2022-12-11 ENCOUNTER — Other Ambulatory Visit (HOSPITAL_COMMUNITY): Payer: Self-pay

## 2022-12-13 ENCOUNTER — Other Ambulatory Visit (HOSPITAL_COMMUNITY): Payer: Self-pay

## 2023-01-10 ENCOUNTER — Other Ambulatory Visit (HOSPITAL_COMMUNITY): Payer: Self-pay

## 2023-02-09 ENCOUNTER — Other Ambulatory Visit (HOSPITAL_COMMUNITY): Payer: Self-pay

## 2023-03-03 ENCOUNTER — Other Ambulatory Visit: Payer: Self-pay

## 2023-03-04 ENCOUNTER — Other Ambulatory Visit: Payer: Self-pay

## 2023-03-04 ENCOUNTER — Emergency Department (HOSPITAL_BASED_OUTPATIENT_CLINIC_OR_DEPARTMENT_OTHER)
Admission: EM | Admit: 2023-03-04 | Discharge: 2023-03-05 | Disposition: A | Discharge status: Home / Self Care | Payer: BC Managed Care – PPO | Source: Home / Self Care | Attending: Emergency Medicine | Admitting: Emergency Medicine

## 2023-03-04 ENCOUNTER — Encounter (HOSPITAL_BASED_OUTPATIENT_CLINIC_OR_DEPARTMENT_OTHER): Payer: Self-pay | Admitting: Emergency Medicine

## 2023-03-04 DIAGNOSIS — D72829 Elevated white blood cell count, unspecified: Secondary | ICD-10-CM | POA: Diagnosis not present

## 2023-03-04 DIAGNOSIS — K8062 Calculus of gallbladder and bile duct with acute cholecystitis without obstruction: Secondary | ICD-10-CM | POA: Diagnosis not present

## 2023-03-04 DIAGNOSIS — R7401 Elevation of levels of liver transaminase levels: Secondary | ICD-10-CM | POA: Diagnosis not present

## 2023-03-04 DIAGNOSIS — K838 Other specified diseases of biliary tract: Secondary | ICD-10-CM | POA: Diagnosis not present

## 2023-03-04 DIAGNOSIS — R1013 Epigastric pain: Secondary | ICD-10-CM | POA: Diagnosis not present

## 2023-03-04 DIAGNOSIS — K828 Other specified diseases of gallbladder: Secondary | ICD-10-CM | POA: Diagnosis not present

## 2023-03-04 DIAGNOSIS — Z79899 Other long term (current) drug therapy: Secondary | ICD-10-CM | POA: Insufficient documentation

## 2023-03-04 DIAGNOSIS — K8000 Calculus of gallbladder with acute cholecystitis without obstruction: Secondary | ICD-10-CM | POA: Diagnosis not present

## 2023-03-04 DIAGNOSIS — R748 Abnormal levels of other serum enzymes: Secondary | ICD-10-CM | POA: Diagnosis not present

## 2023-03-04 DIAGNOSIS — E782 Mixed hyperlipidemia: Secondary | ICD-10-CM | POA: Diagnosis not present

## 2023-03-04 DIAGNOSIS — I2510 Atherosclerotic heart disease of native coronary artery without angina pectoris: Secondary | ICD-10-CM | POA: Diagnosis not present

## 2023-03-04 DIAGNOSIS — R1012 Left upper quadrant pain: Secondary | ICD-10-CM | POA: Diagnosis not present

## 2023-03-04 DIAGNOSIS — Z6832 Body mass index (BMI) 32.0-32.9, adult: Secondary | ICD-10-CM | POA: Diagnosis not present

## 2023-03-04 DIAGNOSIS — R1010 Upper abdominal pain, unspecified: Secondary | ICD-10-CM | POA: Diagnosis not present

## 2023-03-04 DIAGNOSIS — I10 Essential (primary) hypertension: Secondary | ICD-10-CM | POA: Insufficient documentation

## 2023-03-04 DIAGNOSIS — Z888 Allergy status to other drugs, medicaments and biological substances status: Secondary | ICD-10-CM | POA: Diagnosis not present

## 2023-03-04 DIAGNOSIS — K810 Acute cholecystitis: Secondary | ICD-10-CM | POA: Diagnosis not present

## 2023-03-04 DIAGNOSIS — K2900 Acute gastritis without bleeding: Secondary | ICD-10-CM | POA: Diagnosis not present

## 2023-03-04 DIAGNOSIS — K8020 Calculus of gallbladder without cholecystitis without obstruction: Secondary | ICD-10-CM | POA: Diagnosis not present

## 2023-03-04 DIAGNOSIS — E669 Obesity, unspecified: Secondary | ICD-10-CM | POA: Diagnosis not present

## 2023-03-04 DIAGNOSIS — R109 Unspecified abdominal pain: Secondary | ICD-10-CM | POA: Diagnosis not present

## 2023-03-04 DIAGNOSIS — R10811 Right upper quadrant abdominal tenderness: Secondary | ICD-10-CM | POA: Diagnosis not present

## 2023-03-04 DIAGNOSIS — R7989 Other specified abnormal findings of blood chemistry: Secondary | ICD-10-CM | POA: Diagnosis not present

## 2023-03-04 DIAGNOSIS — K219 Gastro-esophageal reflux disease without esophagitis: Secondary | ICD-10-CM | POA: Diagnosis not present

## 2023-03-04 DIAGNOSIS — E871 Hypo-osmolality and hyponatremia: Secondary | ICD-10-CM | POA: Diagnosis not present

## 2023-03-04 DIAGNOSIS — R1084 Generalized abdominal pain: Secondary | ICD-10-CM | POA: Diagnosis not present

## 2023-03-04 DIAGNOSIS — Z8249 Family history of ischemic heart disease and other diseases of the circulatory system: Secondary | ICD-10-CM | POA: Diagnosis not present

## 2023-03-04 DIAGNOSIS — R932 Abnormal findings on diagnostic imaging of liver and biliary tract: Secondary | ICD-10-CM | POA: Diagnosis not present

## 2023-03-04 DIAGNOSIS — Z83438 Family history of other disorder of lipoprotein metabolism and other lipidemia: Secondary | ICD-10-CM | POA: Diagnosis not present

## 2023-03-04 LAB — URINALYSIS, ROUTINE W REFLEX MICROSCOPIC
Bilirubin Urine: NEGATIVE
Glucose, UA: NEGATIVE mg/dL
Hgb urine dipstick: NEGATIVE
Ketones, ur: NEGATIVE mg/dL
Leukocytes,Ua: NEGATIVE
Nitrite: NEGATIVE
Protein, ur: NEGATIVE mg/dL
Specific Gravity, Urine: 1.021 (ref 1.005–1.030)
pH: 7 (ref 5.0–8.0)

## 2023-03-04 LAB — COMPREHENSIVE METABOLIC PANEL
ALT: 21 U/L (ref 0–44)
AST: 20 U/L (ref 15–41)
Albumin: 4.6 g/dL (ref 3.5–5.0)
Alkaline Phosphatase: 58 U/L (ref 38–126)
Anion gap: 11 (ref 5–15)
BUN: 22 mg/dL — ABNORMAL HIGH (ref 6–20)
CO2: 25 mmol/L (ref 22–32)
Calcium: 9.2 mg/dL (ref 8.9–10.3)
Chloride: 101 mmol/L (ref 98–111)
Creatinine, Ser: 1.06 mg/dL (ref 0.61–1.24)
GFR, Estimated: >60 mL/min (ref >60)
Glucose, Bld: 129 mg/dL — ABNORMAL HIGH (ref 70–99)
Potassium: 3.5 mmol/L (ref 3.5–5.1)
Sodium: 137 mmol/L (ref 135–145)
Total Bilirubin: 0.6 mg/dL (ref 0.0–1.2)
Total Protein: 7.3 g/dL (ref 6.5–8.1)

## 2023-03-04 LAB — CBC
HCT: 47.6 % (ref 39.0–52.0)
Hemoglobin: 16.8 g/dL (ref 13.0–17.0)
MCH: 29.9 pg (ref 26.0–34.0)
MCHC: 35.3 g/dL (ref 30.0–36.0)
MCV: 84.8 fL (ref 80.0–100.0)
Platelets: 158 10*3/uL (ref 150–400)
RBC: 5.61 MIL/uL (ref 4.22–5.81)
RDW: 12.2 % (ref 11.5–15.5)
WBC: 14.4 10*3/uL — ABNORMAL HIGH (ref 4.0–10.5)
nRBC: 0 % (ref 0.0–0.2)

## 2023-03-04 LAB — LIPASE, BLOOD: Lipase: 44 U/L (ref 11–51)

## 2023-03-04 NOTE — ED Triage Notes (Signed)
 Upper abdo pain left side X 2 weeks, but worse since 4PM  Seen at [facility], given GI cocktail and toradol for pain sent for eval   No relief with GI cocktail, some relief after toradol

## 2023-03-05 ENCOUNTER — Emergency Department (HOSPITAL_BASED_OUTPATIENT_CLINIC_OR_DEPARTMENT_OTHER): Payer: BC Managed Care – PPO

## 2023-03-05 ENCOUNTER — Other Ambulatory Visit (HOSPITAL_BASED_OUTPATIENT_CLINIC_OR_DEPARTMENT_OTHER): Payer: Self-pay

## 2023-03-05 DIAGNOSIS — R1013 Epigastric pain: Secondary | ICD-10-CM | POA: Diagnosis not present

## 2023-03-05 DIAGNOSIS — R1012 Left upper quadrant pain: Secondary | ICD-10-CM | POA: Diagnosis not present

## 2023-03-05 DIAGNOSIS — K8020 Calculus of gallbladder without cholecystitis without obstruction: Secondary | ICD-10-CM | POA: Diagnosis not present

## 2023-03-05 DIAGNOSIS — K838 Other specified diseases of biliary tract: Secondary | ICD-10-CM | POA: Diagnosis not present

## 2023-03-05 DIAGNOSIS — K828 Other specified diseases of gallbladder: Secondary | ICD-10-CM | POA: Diagnosis not present

## 2023-03-05 MED ORDER — FAMOTIDINE IN NACL 20-0.9 MG/50ML-% IV SOLN
20.0000 mg | Freq: Once | INTRAVENOUS | Status: AC
  Administered 2023-03-05: 20 mg via INTRAVENOUS
  Filled 2023-03-05: qty 50

## 2023-03-05 MED ORDER — HYDROCODONE-ACETAMINOPHEN 5-325 MG PO TABS
1.0000 | ORAL_TABLET | Freq: Four times a day (QID) | ORAL | 0 refills | Status: DC | PRN
  Filled 2023-03-05: qty 12, 2d supply, fill #0

## 2023-03-05 MED ORDER — KETOROLAC TROMETHAMINE 15 MG/ML IJ SOLN
15.0000 mg | Freq: Once | INTRAMUSCULAR | Status: AC
  Administered 2023-03-05: 15 mg via INTRAVENOUS
  Filled 2023-03-05: qty 1

## 2023-03-05 MED ORDER — MORPHINE SULFATE (PF) 4 MG/ML IV SOLN
4.0000 mg | Freq: Once | INTRAVENOUS | Status: AC | PRN
  Administered 2023-03-05: 4 mg via INTRAVENOUS
  Filled 2023-03-05: qty 1

## 2023-03-05 MED ORDER — SODIUM CHLORIDE 0.9 % IV BOLUS
1000.0000 mL | Freq: Once | INTRAVENOUS | Status: AC
  Administered 2023-03-05: 1000 mL via INTRAVENOUS

## 2023-03-05 MED ORDER — HYDROMORPHONE HCL 1 MG/ML IJ SOLN
1.0000 mg | Freq: Once | INTRAMUSCULAR | Status: AC
  Administered 2023-03-05: 1 mg via INTRAVENOUS
  Filled 2023-03-05: qty 1

## 2023-03-05 MED ORDER — ONDANSETRON 4 MG PO TBDP
4.0000 mg | ORAL_TABLET | Freq: Four times a day (QID) | ORAL | 0 refills | Status: DC | PRN
  Filled 2023-03-05: qty 10, 3d supply, fill #0

## 2023-03-05 MED ORDER — ONDANSETRON HCL 4 MG/2ML IJ SOLN
4.0000 mg | Freq: Once | INTRAMUSCULAR | Status: AC
  Administered 2023-03-05: 4 mg via INTRAVENOUS
  Filled 2023-03-05: qty 2

## 2023-03-05 MED ORDER — IOHEXOL 300 MG/ML  SOLN
100.0000 mL | Freq: Once | INTRAMUSCULAR | Status: AC | PRN
  Administered 2023-03-05: 100 mL via INTRAVENOUS

## 2023-03-05 MED ORDER — PANTOPRAZOLE SODIUM 40 MG IV SOLR
40.0000 mg | Freq: Once | INTRAVENOUS | Status: AC
  Administered 2023-03-05: 40 mg via INTRAVENOUS
  Filled 2023-03-05: qty 10

## 2023-03-05 MED ORDER — MORPHINE SULFATE (PF) 4 MG/ML IV SOLN
4.0000 mg | Freq: Once | INTRAVENOUS | Status: AC
  Administered 2023-03-05: 4 mg via INTRAVENOUS
  Filled 2023-03-05: qty 1

## 2023-03-05 MED ORDER — ONDANSETRON HCL 4 MG/2ML IJ SOLN
4.0000 mg | Freq: Once | INTRAMUSCULAR | Status: AC | PRN
  Administered 2023-03-05: 4 mg via INTRAVENOUS
  Filled 2023-03-05: qty 2

## 2023-03-05 NOTE — ED Provider Notes (Addendum)
 [facility] Provider Note  CSN: [id number] Arrival date & time: 03/04/23 2105  Chief Complaint(s) Abdominal Pain  HPI Gregory Torres is a 58 y.o. male with past medical history as below, significant for GERD, hypertension, CAD, pseudogout who presents to the ED with complaint of abdominal pain  Intermittent epigastric abdominal pain over the past 3 to 4 weeks.  Worse in the past 24 hours.  Pain primarily at the epigastrium, pain occurred approximately 3 to 4 hours after p.o. intake.  No vomiting.  No BRBPR or melena, no change to bowel output.  No urination changes.  No daily alcohol use, no daily NSAID use.  He was seen by gastroenterology in the past and started on Protonix  which she is compliant with.  Urgent care earlier this evening and received GI cocktail which did not alleviate symptoms and came to the ER for further evaluation  No prior abdominal surgeries  Past Medical History Past Medical History:  Diagnosis Date   GERD (gastroesophageal reflux disease)    Hypertension    Patient Active Problem List   Diagnosis Date Noted   Coronary artery disease involving native coronary artery of native heart without angina pectoris 10/08/2022   Agatston coronary artery calcium  score between 100 and 199 10/08/2022   Myalgia due to statin 10/08/2022   Pseudogout of left knee 04/20/2021   Hypertension 08/14/2020   Impaired fasting glucose 08/14/2020   Mixed hyperlipidemia 08/14/2020   Low back pain 06/21/2013   S/P arthroscopy of knee 06/21/2013   Right knee pain 05/25/2013   Home Medication(s) Prior to Admission medications   Medication Sig Start Date End Date Taking? Authorizing Provider  amLODipine  (NORVASC ) 10 MG tablet Take 1 tablet (10 mg total) by mouth daily. 10/17/20 10/01/22  Barbaraann Darryle Ned, MD  carvedilol  (COREG ) 12.5 MG tablet Take 1 tablet (12.5 mg total) by mouth 2 (two) times daily. 10/01/22 12/30/22  Barbaraann Darryle Ned,  MD  escitalopram (LEXAPRO) 5 MG tablet Take 5 mg by mouth daily. 05/14/22   [provider]  Evolocumab  (REPATHA  SURECLICK) 140 MG/ML SOAJ Inject 140 mg into the skin every 14 (fourteen) days. 10/20/22   O'NealDarryle Ned, MD  ezetimibe  (ZETIA ) 10 MG tablet Take 10 mg by mouth daily. 08/19/22   [provider]  hydrochlorothiazide (HYDRODIURIL) 25 MG tablet Take 25 mg by mouth every morning. 12/05/18   [provider]  omeprazole (PRILOSEC) 10 MG capsule Take by mouth.    [provider]  pantoprazole  (PROTONIX ) 40 MG tablet Take 40 mg by mouth daily. 08/19/22   [provider]                                                                                                                                    Past Surgical History Past Surgical History:  Procedure Laterality Date   KNEE ARTHROSCOPY     Family History Family  History  Problem Relation Age of Onset   Heart disease Father    Hyperlipidemia Brother     Social History Social History   Tobacco Use   Smoking status: Never   Smokeless tobacco: Never  Substance Use Topics   Alcohol use: Yes   Drug use: Never   Allergies Patient has no known allergies.  Review of Systems Review of Systems  Constitutional:  Negative for chills and fever.  HENT:  Negative for congestion.   Respiratory:  Negative for chest tightness and shortness of breath.   Cardiovascular:  Negative for chest pain and palpitations.  Gastrointestinal:  Positive for abdominal pain and nausea. Negative for constipation, diarrhea and vomiting.  Genitourinary:  Negative for difficulty urinating and dysuria.  Neurological:  Negative for numbness.  All other systems reviewed and are negative.   Physical Exam Vital Signs  I have reviewed the triage vital signs BP (!) 177/104   Pulse 74   Temp 98.4 F (36.9 C) (Oral)   Resp 20   SpO2 98%  Physical Exam Vitals and nursing note reviewed.  Constitutional:       General: He is not in acute distress.    Appearance: He is well-developed.  HENT:     Head: Normocephalic and atraumatic.     Right Ear: External ear normal.     Left Ear: External ear normal.     Mouth/Throat:     Mouth: Mucous membranes are moist.  Eyes:     General: No scleral icterus. Cardiovascular:     Rate and Rhythm: Normal rate and regular rhythm.     Pulses: Normal pulses.     Heart sounds: Normal heart sounds.  Pulmonary:     Effort: Pulmonary effort is normal. No respiratory distress.     Breath sounds: Normal breath sounds.  Abdominal:     General: Abdomen is flat.     Palpations: Abdomen is soft.     Tenderness: There is abdominal tenderness in the epigastric area. There is no guarding or rebound. Negative signs include Murphy's sign.  Musculoskeletal:     Cervical back: No rigidity.     Right lower leg: No edema.     Left lower leg: No edema.  Skin:    General: Skin is warm and dry.     Capillary Refill: Capillary refill takes less than 2 seconds.  Neurological:     Mental Status: He is alert.  Psychiatric:        Mood and Affect: Mood normal.        Behavior: Behavior normal.     ED Results and Treatments Labs (all labs ordered are listed, but only abnormal results are displayed) Labs Reviewed  CBC - Abnormal; Notable for the following components:      Result Value   WBC 14.4 (*)    All other components within normal limits  COMPREHENSIVE METABOLIC PANEL - Abnormal; Notable for the following components:   Glucose, Bld 129 (*)    BUN 22 (*)    All other components within normal limits  URINALYSIS, ROUTINE W REFLEX MICROSCOPIC  LIPASE, BLOOD  Radiology CT ABDOMEN PELVIS W CONTRAST Result Date: 03/05/2023 CLINICAL DATA:  Upper abdominal pain on the left side for 2 weeks. Worsening this evening. EXAM: CT ABDOMEN AND PELVIS WITH  CONTRAST TECHNIQUE: Multidetector CT imaging of the abdomen and pelvis was performed using the standard protocol following bolus administration of intravenous contrast. RADIATION DOSE REDUCTION: This exam was performed according to the departmental dose-optimization program which includes automated exposure control, adjustment of the mA and/or kV according to patient size and/or use of iterative reconstruction technique. CONTRAST:  OMNIPAQUE  IOHEXOL  300 MG/ML  SOLN COMPARISON:  None Available. FINDINGS: Lower chest: No acute abnormality. Hepatobiliary: Unremarkable liver. Trace pericholecystic fluid about the nondistended bladder. Possible tiny gallstones in the gallbladder neck. No biliary dilation. Pancreas: Unremarkable. Spleen: Unremarkable. Adrenals/Urinary Tract: Normal adrenal glands. No urinary calculi or hydronephrosis. Bladder is unremarkable. Stomach/Bowel: Normal caliber large and small bowel. No bowel wall thickening. The appendix is normal.Stomach is within normal limits. Vascular/Lymphatic: Aortic atherosclerosis. No enlarged abdominal or pelvic lymph nodes. Reproductive: Unremarkable. Other: No free intraperitoneal fluid or air. Musculoskeletal: No acute fracture. Chronic bilateral L5 pars defects with grade 1 anterolisthesis of L5. IMPRESSION: 1. Trace pericholecystic fluid about the nondistended gallbladder. Possible tiny gallstones in the gallbladder neck. Consider right upper quadrant ultrasound for further evaluation. 2. Otherwise no acute abnormality in the abdomen or pelvis. 3. Chronic L5 spondylolysis with spondylolisthesis. Aortic Atherosclerosis (ICD10-I70.0). Electronically Signed   By: Norman Gatlin M.D.   On: 03/05/2023 01:02    Pertinent labs & imaging results that were available during my care of the patient were reviewed by me and considered in my medical decision making (see MDM for details).  Medications Ordered in ED Medications  pantoprazole  (PROTONIX ) injection 40  mg (has no administration in time range)  sodium chloride  0.9 % bolus 1,000 mL (0 mLs Intravenous Stopped 03/05/23 0203)  ketorolac  (TORADOL ) 15 MG/ML injection 15 mg (15 mg Intravenous Given 03/05/23 0032)  famotidine  (PEPCID ) IVPB 20 mg premix (0 mg Intravenous Stopped 03/05/23 0126)  iohexol  (OMNIPAQUE ) 300 MG/ML solution 100 mL (100 mLs Intravenous Contrast Given 03/05/23 0040)  morphine  (PF) 4 MG/ML injection 4 mg (4 mg Intravenous Given 03/05/23 0256)  ondansetron  (ZOFRAN ) injection 4 mg (4 mg Intravenous Given 03/05/23 0255)                                                                                                                                     Procedures Procedures  (including critical care time)  Medical Decision Making / ED Course    Medical Decision Making:    Gregory Torres is a 58 y.o. male with past medical history as below, significant for GERD, hypertension, CAD, pseudogout who presents to the ED with complaint of abdominal pain. The complaint involves an extensive differential diagnosis and also carries with it a high risk of complications and morbidity.  Serious etiology was considered. Ddx includes but is not  limited to: Differential diagnosis includes but is not exclusive to acute cholecystitis, intrathoracic causes for epigastric abdominal pain, gastritis, duodenitis, pancreatitis, small bowel or large bowel obstruction, abdominal aortic aneurysm, hernia, gastritis, etc.   Complete initial physical exam performed, notably the patient was in no distress, abd not peritoneal.    Reviewed and confirmed nursing documentation for past medical history, family history, social history.  Vital signs reviewed.         Brief summary: 57-year male with history as below including GERD on Protonix  here with epigastric pain.  Ongoing approximate 1 month, worse in the past 24 hours.  Labs reviewed, he has slight leukocytosis 14.4.  BUN is also slightly elevated.  CT imaging with  pericholecystic fluid, small gallstones.  He has no Murphy sign.  Does have significant epigastric discomfort which is reproducible on exam.  No jaundice or fever.  Ongoing pain.  Cholecystitis is certainly consideration.  Ultrasound ordered, tech will be here at 0800. In the interim will control his pain and monitor here.   PUD is also concern given history of acid reflux, gastritis. No duodenitis on CT Imaging per rads. He is currently on PPI.  Would likely benefit from GI evaluation in the office in the event of discharge              Additional history obtained: -Additional history obtained from spouse -External records from outside source obtained and reviewed including: Chart review including previous notes, labs, imaging, consultation notes including  Primary care documentation, prior labs and imaging He had CT coronary 8/22 with a coronary calcium  score of 161, mild atherosclerosis   Lab Tests: -I ordered, reviewed, and interpreted labs.   The pertinent results include:   Labs Reviewed  CBC - Abnormal; Notable for the following components:      Result Value   WBC 14.4 (*)    All other components within normal limits  COMPREHENSIVE METABOLIC PANEL - Abnormal; Notable for the following components:   Glucose, Bld 129 (*)    BUN 22 (*)    All other components within normal limits  URINALYSIS, ROUTINE W REFLEX MICROSCOPIC  LIPASE, BLOOD    Notable for as above  EKG   EKG Interpretation Date/Time:    Ventricular Rate:    PR Interval:    QRS Duration:    QT Interval:    QTC Calculation:   R Axis:      Text Interpretation:           Imaging Studies ordered: I ordered imaging studies including CTAP I independently visualized the following imaging with scope of interpretation limited to determining acute life threatening conditions related to emergency care; findings noted above I independently visualized and interpreted imaging. I agree with the radiologist  interpretation   Medicines ordered and prescription drug management: Meds ordered this encounter  Medications   sodium chloride  0.9 % bolus 1,000 mL   ketorolac  (TORADOL ) 15 MG/ML injection 15 mg   famotidine  (PEPCID ) IVPB 20 mg premix   iohexol  (OMNIPAQUE ) 300 MG/ML solution 100 mL   morphine  (PF) 4 MG/ML injection 4 mg   ondansetron  (ZOFRAN ) injection 4 mg   pantoprazole  (PROTONIX ) injection 40 mg    -I have reviewed the patients home medicines and have made adjustments as needed   Consultations Obtained: na   Cardiac Monitoring: Continuous pulse oximetry interpreted by myself, 99% on RA.    Social Determinants of Health:  Diagnosis or treatment significantly limited by social determinants of health: obese  Reevaluation: After the interventions noted above, I reevaluated the patient and found that they have improved  Co morbidities that complicate the patient evaluation  Past Medical History:  Diagnosis Date   GERD (gastroesophageal reflux disease)    Hypertension       Dispostion: Disposition decision including need for hospitalization was considered, and patient transferred.    Final Clinical Impression(s) / ED Diagnoses Final diagnoses:  Epigastric pain        Elnor Jayson LABOR, DO 03/05/23 0228    Elnor Jayson LABOR, DO 03/05/23 0310

## 2023-03-05 NOTE — ED Notes (Signed)
 Mr. Seier received 1mg  of Dilaudid at 82.  O2 sat dropped to 87% on room air after medication was given.  Placed Mr. Fritzler on 2L O2 via [place]. O2 sat increased to 100%

## 2023-03-05 NOTE — ED Provider Notes (Signed)
 Care was taken over from Dr. Elnor.  In short, patient presented with epigastric/upper abdominal pain.  CT was equivocal for gallbladder disease.  Ultrasound was performed which shows evidence of gallstone but no suggestions of cholecystitis.  Labs reviewed.  His white count is little elevated but he is not febrile.  LFTs are normal.  Lipase is normal.  Patient was treated symptomatically in the ED.  He initially had some ongoing pain and was given a milligram of Dilaudid .  He had a little bit of hypoxia related to the medication administration but was monitored for several hours after this and was maintaining normal oxygen saturation without supplemental oxygen.  Discussed options with him.  At this point, he feels comfortable going home.  He was discharged home in good condition.  Was given a prescription for Vicodin and Zofran .  Was given a referral to follow-up with surgery.  Was advised in a strict nonfat diet.  Return precautions were given.   Lenor Hollering, MD 03/05/23 1220

## 2023-03-07 ENCOUNTER — Inpatient Hospital Stay (HOSPITAL_COMMUNITY): Payer: BC Managed Care – PPO

## 2023-03-07 ENCOUNTER — Other Ambulatory Visit: Payer: Self-pay

## 2023-03-07 ENCOUNTER — Other Ambulatory Visit (HOSPITAL_COMMUNITY): Payer: BC Managed Care – PPO

## 2023-03-07 ENCOUNTER — Emergency Department (HOSPITAL_COMMUNITY): Payer: BC Managed Care – PPO

## 2023-03-07 ENCOUNTER — Observation Stay (HOSPITAL_COMMUNITY)
Admission: EM | Admit: 2023-03-07 | Discharge: 2023-03-09 | Disposition: A | Discharge status: Home / Self Care | Payer: BC Managed Care – PPO | Attending: Emergency Medicine | Admitting: Emergency Medicine

## 2023-03-07 ENCOUNTER — Encounter (HOSPITAL_COMMUNITY): Payer: Self-pay

## 2023-03-07 DIAGNOSIS — D72829 Elevated white blood cell count, unspecified: Secondary | ICD-10-CM | POA: Insufficient documentation

## 2023-03-07 DIAGNOSIS — E782 Mixed hyperlipidemia: Secondary | ICD-10-CM | POA: Diagnosis not present

## 2023-03-07 DIAGNOSIS — R7989 Other specified abnormal findings of blood chemistry: Secondary | ICD-10-CM | POA: Diagnosis present

## 2023-03-07 DIAGNOSIS — Z6832 Body mass index (BMI) 32.0-32.9, adult: Secondary | ICD-10-CM | POA: Diagnosis not present

## 2023-03-07 DIAGNOSIS — R10811 Right upper quadrant abdominal tenderness: Secondary | ICD-10-CM | POA: Diagnosis not present

## 2023-03-07 DIAGNOSIS — K838 Other specified diseases of biliary tract: Secondary | ICD-10-CM | POA: Diagnosis not present

## 2023-03-07 DIAGNOSIS — R1084 Generalized abdominal pain: Secondary | ICD-10-CM | POA: Diagnosis not present

## 2023-03-07 DIAGNOSIS — K8062 Calculus of gallbladder and bile duct with acute cholecystitis without obstruction: Secondary | ICD-10-CM | POA: Diagnosis present

## 2023-03-07 DIAGNOSIS — I10 Essential (primary) hypertension: Secondary | ICD-10-CM | POA: Diagnosis present

## 2023-03-07 DIAGNOSIS — Z79899 Other long term (current) drug therapy: Secondary | ICD-10-CM | POA: Diagnosis not present

## 2023-03-07 DIAGNOSIS — R109 Unspecified abdominal pain: Secondary | ICD-10-CM | POA: Diagnosis not present

## 2023-03-07 DIAGNOSIS — K219 Gastro-esophageal reflux disease without esophagitis: Secondary | ICD-10-CM | POA: Diagnosis present

## 2023-03-07 DIAGNOSIS — Z888 Allergy status to other drugs, medicaments and biological substances status: Secondary | ICD-10-CM | POA: Diagnosis not present

## 2023-03-07 DIAGNOSIS — K8020 Calculus of gallbladder without cholecystitis without obstruction: Secondary | ICD-10-CM | POA: Diagnosis not present

## 2023-03-07 DIAGNOSIS — K810 Acute cholecystitis: Principal | ICD-10-CM | POA: Diagnosis present

## 2023-03-07 DIAGNOSIS — I2510 Atherosclerotic heart disease of native coronary artery without angina pectoris: Secondary | ICD-10-CM | POA: Diagnosis present

## 2023-03-07 DIAGNOSIS — E669 Obesity, unspecified: Secondary | ICD-10-CM | POA: Diagnosis present

## 2023-03-07 DIAGNOSIS — Z8249 Family history of ischemic heart disease and other diseases of the circulatory system: Secondary | ICD-10-CM

## 2023-03-07 DIAGNOSIS — Z83438 Family history of other disorder of lipoprotein metabolism and other lipidemia: Secondary | ICD-10-CM | POA: Diagnosis not present

## 2023-03-07 DIAGNOSIS — R7401 Elevation of levels of liver transaminase levels: Secondary | ICD-10-CM | POA: Insufficient documentation

## 2023-03-07 DIAGNOSIS — K8000 Calculus of gallbladder with acute cholecystitis without obstruction: Principal | ICD-10-CM | POA: Insufficient documentation

## 2023-03-07 DIAGNOSIS — E871 Hypo-osmolality and hyponatremia: Secondary | ICD-10-CM | POA: Diagnosis present

## 2023-03-07 DIAGNOSIS — K828 Other specified diseases of gallbladder: Secondary | ICD-10-CM | POA: Diagnosis not present

## 2023-03-07 HISTORY — DX: Calculus of gallbladder without cholecystitis without obstruction: K80.20

## 2023-03-07 LAB — COMPREHENSIVE METABOLIC PANEL
ALT: 296 U/L — ABNORMAL HIGH (ref 0–44)
AST: 412 U/L — ABNORMAL HIGH (ref 15–41)
Albumin: 3.5 g/dL (ref 3.5–5.0)
Alkaline Phosphatase: 126 U/L (ref 38–126)
Anion gap: 7 (ref 5–15)
BUN: 11 mg/dL (ref 6–20)
CO2: 29 mmol/L (ref 22–32)
Calcium: 8.8 mg/dL — ABNORMAL LOW (ref 8.9–10.3)
Chloride: 98 mmol/L (ref 98–111)
Creatinine, Ser: 1.07 mg/dL (ref 0.61–1.24)
GFR, Estimated: >60 mL/min (ref >60)
Glucose, Bld: 135 mg/dL — ABNORMAL HIGH (ref 70–99)
Potassium: 4 mmol/L (ref 3.5–5.1)
Sodium: 134 mmol/L — ABNORMAL LOW (ref 135–145)
Total Bilirubin: 3.6 mg/dL — ABNORMAL HIGH (ref 0.0–1.2)
Total Protein: 6.6 g/dL (ref 6.5–8.1)

## 2023-03-07 LAB — CBC WITH DIFFERENTIAL/PLATELET
Abs Immature Granulocytes: 0.05 10*3/uL (ref 0.00–0.07)
Basophils Absolute: 0 10*3/uL (ref 0.0–0.1)
Basophils Relative: 0 %
Eosinophils Absolute: 0 10*3/uL (ref 0.0–0.5)
Eosinophils Relative: 0 %
HCT: 43 % (ref 39.0–52.0)
Hemoglobin: 15 g/dL (ref 13.0–17.0)
Immature Granulocytes: 0 %
Lymphocytes Relative: 1 %
Lymphs Abs: 0.1 10*3/uL — ABNORMAL LOW (ref 0.7–4.0)
MCH: 30.7 pg (ref 26.0–34.0)
MCHC: 34.9 g/dL (ref 30.0–36.0)
MCV: 87.9 fL (ref 80.0–100.0)
Monocytes Absolute: 0.4 10*3/uL (ref 0.1–1.0)
Monocytes Relative: 4 %
Neutro Abs: 10.9 10*3/uL — ABNORMAL HIGH (ref 1.7–7.7)
Neutrophils Relative %: 95 %
Platelets: 174 10*3/uL (ref 150–400)
RBC: 4.89 MIL/uL (ref 4.22–5.81)
RDW: 12.1 % (ref 11.5–15.5)
WBC: 11.6 10*3/uL — ABNORMAL HIGH (ref 4.0–10.5)
nRBC: 0 % (ref 0.0–0.2)

## 2023-03-07 LAB — HIV ANTIBODY (ROUTINE TESTING W REFLEX): HIV Screen 4th Generation wRfx: NONREACTIVE

## 2023-03-07 LAB — LIPASE, BLOOD: Lipase: 34 U/L (ref 11–51)

## 2023-03-07 LAB — CBG MONITORING, ED: Glucose-Capillary: 139 mg/dL — ABNORMAL HIGH (ref 70–99)

## 2023-03-07 MED ORDER — ACETAMINOPHEN 650 MG RE SUPP: 650.0000 mg | Freq: Four times a day (QID) | RECTAL | Status: DC | PRN

## 2023-03-07 MED ORDER — HYDROMORPHONE HCL 1 MG/ML IJ SOLN: 1.0000 mg | Freq: Once | INTRAMUSCULAR | Status: DC | PRN

## 2023-03-07 MED ORDER — AMLODIPINE BESYLATE 10 MG PO TABS
10.0000 mg | ORAL_TABLET | Freq: Every day | ORAL | Status: DC
  Administered 2023-03-09: 10 mg via ORAL
  Filled 2023-03-07: qty 1

## 2023-03-07 MED ORDER — SODIUM CHLORIDE 0.9 % IV SOLN
2.0000 g | INTRAVENOUS | Status: DC
  Administered 2023-03-08 – 2023-03-09 (×2): 2 g via INTRAVENOUS
  Filled 2023-03-07 (×2): qty 20

## 2023-03-07 MED ORDER — HYDROMORPHONE HCL 1 MG/ML IJ SOLN
1.0000 mg | INTRAMUSCULAR | Status: AC | PRN
  Administered 2023-03-08: 1 mg via INTRAVENOUS
  Filled 2023-03-07: qty 1

## 2023-03-07 MED ORDER — SODIUM CHLORIDE 0.9% FLUSH: 3.0000 mL | INTRAVENOUS | Status: DC | PRN

## 2023-03-07 MED ORDER — SODIUM CHLORIDE 0.9 % IV SOLN: 250.0000 mL | INTRAVENOUS | Status: AC | PRN

## 2023-03-07 MED ORDER — HYDRALAZINE HCL 20 MG/ML IJ SOLN: 5.0000 mg | Freq: Four times a day (QID) | INTRAMUSCULAR | Status: DC | PRN

## 2023-03-07 MED ORDER — ONDANSETRON 4 MG PO TBDP: 4.0000 mg | ORAL_TABLET | Freq: Four times a day (QID) | ORAL | Status: DC | PRN

## 2023-03-07 MED ORDER — PANTOPRAZOLE SODIUM 40 MG IV SOLR
40.0000 mg | INTRAVENOUS | Status: DC
  Administered 2023-03-07 – 2023-03-08 (×2): 40 mg via INTRAVENOUS
  Filled 2023-03-07 (×2): qty 10

## 2023-03-07 MED ORDER — SODIUM CHLORIDE 0.9 % IV SOLN: INTRAVENOUS | Status: AC

## 2023-03-07 MED ORDER — CARVEDILOL 12.5 MG PO TABS
12.5000 mg | ORAL_TABLET | Freq: Two times a day (BID) | ORAL | Status: DC
  Administered 2023-03-07 – 2023-03-09 (×4): 12.5 mg via ORAL
  Filled 2023-03-07: qty 1
  Filled 2023-03-07 (×3): qty 4

## 2023-03-07 MED ORDER — SODIUM CHLORIDE 0.9% FLUSH
3.0000 mL | Freq: Two times a day (BID) | INTRAVENOUS | Status: DC
  Administered 2023-03-07 – 2023-03-09 (×3): 3 mL via INTRAVENOUS

## 2023-03-07 MED ORDER — ONDANSETRON HCL 4 MG/2ML IJ SOLN: 4.0000 mg | Freq: Four times a day (QID) | INTRAMUSCULAR | Status: DC | PRN

## 2023-03-07 MED ORDER — SODIUM CHLORIDE 0.9 % IV SOLN
2.0000 g | Freq: Once | INTRAVENOUS | Status: AC
  Administered 2023-03-07: 2 g via INTRAVENOUS
  Filled 2023-03-07: qty 20

## 2023-03-07 MED ORDER — LORAZEPAM 2 MG/ML IJ SOLN
1.0000 mg | Freq: Once | INTRAMUSCULAR | Status: AC
  Administered 2023-03-07: 1 mg via INTRAVENOUS
  Filled 2023-03-07: qty 1

## 2023-03-07 MED ORDER — METRONIDAZOLE 500 MG/100ML IV SOLN
500.0000 mg | Freq: Two times a day (BID) | INTRAVENOUS | Status: DC
  Administered 2023-03-07: 500 mg via INTRAVENOUS
  Filled 2023-03-07: qty 100

## 2023-03-07 MED ORDER — ACETAMINOPHEN 325 MG PO TABS
650.0000 mg | ORAL_TABLET | Freq: Four times a day (QID) | ORAL | Status: DC | PRN
  Administered 2023-03-07 – 2023-03-08 (×3): 650 mg via ORAL
  Filled 2023-03-07 (×3): qty 2

## 2023-03-07 MED ORDER — GADOBUTROL 1 MMOL/ML IV SOLN
9.0000 mL | Freq: Once | INTRAVENOUS | Status: AC | PRN
  Administered 2023-03-07: 9 mL via INTRAVENOUS

## 2023-03-07 NOTE — H&P (Addendum)
 [facility] History and Physical  Gregory Torres FMW:[id number] DOB: 1965/10/03 DOA: 03/07/2023  Referring physician: ED  PCP: Gregory Prentice SAUNDERS, FNP   Patient is coming from: Home  Chief Complaint: Abdominal pain  HPI:  Patient is a 58 years old male with past medical history of hypertension, hyperlipidemia, GERD presented to hospital with abdominal pain.  Patient had initially gone to [facility] ED on 03/04/2023 for abdominal pain and had workup done CT scan of the abdomen and ultrasound which showed some gallstones and was referred to GI but continued to have worsening epigastric pain so presented to the hospital.  He did have some nausea but denies obvious vomiting.  Has not had a bowel movement.  He was taking Vicodin for pain with mild relief.  Also complained of subjective chills but no obvious fever.  Denied any shortness of breath cough but mild chest pain while taking a deep breath.  Patient denies any urinary urgency, frequency or dysuria.  Denies any sick contacts or recent travel.  Has been having dark urination.  In the ED,.  Initial blood pressure was elevated.  Had mild low-grade fever.  Patient was noted to have tenderness over the epigastric region with a positive Murphy sign.  Labs showed hyponatremia with sodium of 134 with AST and ALT elevation at 412 and 496 with bilirubin elevation at 3.6.  WBC was elevated at 11.6.  Right upper quadrant ultrasound showed dilated gallbladder with sludge and stones with progressive wall thickening suggestive of acute cholecystitis.  Recent CT scan from 03/04/2022 reviewed.  Patient received Dilaudid , Rocephin  in the ED and was considered for admission to hospital for further evaluation due to general surgery requesting GI workup and medical optimization.   Assessment plan  Principal Problem:   Acute cholecystitis Active Problems:   Hypertension   Mixed hyperlipidemia   LFT elevation   Acute cholecystitis.    Elevated LFT.  General  surgery was consulted who recommended GI consultation as well.  Might need  cholecystectomy.  Continue n.p.o, sips with meds. IV fluids.  Lipase was 34.  Continue IV antibiotics with Rocephin  and Flagyl .  GI to see the patient.  MRCP has been ordered.  Elevated LFT.  AST of 412 with ALT of 296.  Total bilirubin 3.6.  ED provider spoken with Dr. [name] who recommended MRCP of the abdomen and GI to see the patient in AM.  Leukocytosis could be secondary to cholecystitis.  Hypertension On amlodipine  and Coreg  at home.  Will resume while in the hospital.  Add as needed hydralazine .  Hyperlipidemia On Repatha  and ezetimibe  at home.  Will hold for now.  Intolerant to statins.  GERD.  Will add IV Protonix .   DVT Prophylaxis: SCD  Review of Systems:  All systems were reviewed and were negative unless otherwise mentioned in the HPI   Past Medical History:  Diagnosis Date   Gallstones    GERD (gastroesophageal reflux disease)    Hypertension    Past Surgical History:  Procedure Laterality Date   KNEE ARTHROSCOPY      Social History:  reports that he has never smoked. He has never used smokeless tobacco. He reports current alcohol use. He reports that he does not use drugs.  Allergies  Allergen Reactions   Losartan Other (See Comments)    Caused sweating and clamminess    Family History  Problem Relation Age of Onset   Heart disease Father    Hyperlipidemia Brother      Prior to  Admission medications   Medication Sig Start Date End Date Taking? Authorizing Provider  amLODipine  (NORVASC ) 10 MG tablet Take 1 tablet (10 mg total) by mouth daily. 10/17/20 03/07/23 Yes O'Neal, Darryle Ned, MD  carvedilol  (COREG ) 12.5 MG tablet Take 1 tablet (12.5 mg total) by mouth 2 (two) times daily. 10/01/22 03/07/23 Yes O'Neal, Darryle Ned, MD  Evolocumab  (REPATHA  SURECLICK) 140 MG/ML SOAJ Inject 140 mg into the skin every 14 (fourteen) days. 10/20/22  Yes O'Neal, Darryle Ned, MD  ezetimibe   (ZETIA ) 10 MG tablet Take 10 mg by mouth daily. 08/19/22  Yes [provider]  HYDROcodone -acetaminophen  (NORCO/VICODIN) 5-325 MG tablet Take 1-2 tablets by mouth every 6 (six) hours as needed for moderate pain (pain score 4-6). 03/05/23  Yes Gregory Hollering, MD  ibuprofen (HM IBUPROFEN) 200 MG tablet Take 200-400 mg by mouth every 6 (six) hours as needed for mild pain (pain score 1-3) or headache.   Yes [provider]  ondansetron  (ZOFRAN -ODT) 4 MG disintegrating tablet Take 1 tablet (4 mg total) by mouth 4 (four) times daily as needed for nause / vomiting Patient taking differently: Take 4 mg by mouth 4 (four) times daily as needed for nausea or vomiting (DISSOLVE ORALLY). 03/05/23  Yes Gregory Hollering, MD  pantoprazole  (PROTONIX ) 40 MG tablet Take 40 mg by mouth daily before breakfast. 08/19/22  Yes [provider]  TYLENOL  500 MG tablet Take 500-1,000 mg by mouth every 6 (six) hours as needed for mild pain (pain score 1-3) or headache.   Yes [provider]  escitalopram (LEXAPRO) 5 MG tablet Take 5 mg by mouth daily. Patient not taking: Reported on 03/07/2023    [provider]    Physical Exam: Vitals:   03/07/23 1327 03/07/23 1334 03/07/23 1627  BP: (!) 175/102  126/85  Pulse: 86  98  Resp: 16  18  Temp: 98 F (36.7 C)    TempSrc: Oral    SpO2: 97%  93%  Weight:  93 kg   Height:  5' 7 (1.702 m)    Wt Readings from Last 3 Encounters:  03/07/23 93 kg  10/01/22 91.8 kg  10/03/20 90.5 kg   Body mass index is 32.11 kg/m.  General: Obese built, not in obvious distress HENT: Normocephalic, No scleral pallor or icterus noted. Oral mucosa is moist.  Chest:  Clear breath sounds.  . No crackles or wheezes.  CVS: S1 &S2 heard. No murmur.  Regular rate and rhythm. Abdomen: Soft, gastric tenderness noted, Murphy sign positive.  Bowel sounds are heard. No abdominal mass palpated Extremities: No cyanosis, clubbing or edema.  Peripheral pulses are  palpable. Psych: Alert, awake and oriented, normal mood CNS:  No cranial nerve deficits.  Power equal in all extremities.   Skin: Warm and dry.  No rashes noted.  Labs on Admission:   CBC: Recent Labs  Lab 03/04/23 2124 03/07/23 1525  WBC 14.4* 11.6*  NEUTROABS  --  10.9*  HGB 16.8 15.0  HCT 47.6 43.0  MCV 84.8 87.9  PLT 158 174    Basic Metabolic Panel: Recent Labs  Lab 03/04/23 2211 03/07/23 1525  NA 137 134*  K 3.5 4.0  CL 101 98  CO2 25 29  GLUCOSE 129* 135*  BUN 22* 11  CREATININE 1.06 1.07  CALCIUM  9.2 8.8*    Liver Function Tests: Recent Labs  Lab 03/04/23 2211 03/07/23 1525  AST 20 412*  ALT 21 296*  ALKPHOS 58 126  BILITOT 0.6 3.6*  PROT  7.3 6.6  ALBUMIN 4.6 3.5   Recent Labs  Lab 03/04/23 2211 03/07/23 1525  LIPASE 44 34   No results for input(s): AMMONIA in the last 168 hours.  Cardiac Enzymes: No results for input(s): CKTOTAL, CKMB, CKMBINDEX, TROPONINI in the last 168 hours.  BNP (last 3 results) No results for input(s): BNP in the last 8760 hours.  ProBNP (last 3 results) No results for input(s): PROBNP in the last 8760 hours.  CBG: No results for input(s): GLUCAP in the last 168 hours.  Lipase     Component Value Date/Time   LIPASE 34 03/07/2023 1525     Urinalysis    Component Value Date/Time   COLORURINE YELLOW 03/04/2023 2121   APPEARANCEUR CLEAR 03/04/2023 2121   LABSPEC 1.021 03/04/2023 2121   PHURINE 7.0 03/04/2023 2121   GLUCOSEU NEGATIVE 03/04/2023 2121   HGBUR NEGATIVE 03/04/2023 2121   BILIRUBINUR NEGATIVE 03/04/2023 2121   KETONESUR NEGATIVE 03/04/2023 2121   PROTEINUR NEGATIVE 03/04/2023 2121   NITRITE NEGATIVE 03/04/2023 2121   LEUKOCYTESUR NEGATIVE 03/04/2023 2121     Drugs of Abuse  No results found for: LABOPIA, COCAINSCRNUR, LABBENZ, AMPHETMU, THCU, LABBARB    Radiological Exams on Admission: US  Abdomen Limited RUQ (LIVER/GB) Result Date: 03/07/2023 CLINICAL DATA:   Upper abdominal pain EXAM: ULTRASOUND ABDOMEN LIMITED RIGHT UPPER QUADRANT COMPARISON:  Ultrasound 03/05/2023.  CT scan 03/04/2023. FINDINGS: Gallbladder: Distended gallbladder. Wall thickening up to 6 mm. Layering stones and sludge. No reported sonographic Murphy's sign. However the patient as per the sonographer had received pain medication. Common bile duct: Diameter: 5 mm Liver: No focal lesion identified. Within normal limits in parenchymal echogenicity. Portal vein is patent on color Doppler imaging with normal direction of blood flow towards the liver. Other: None. IMPRESSION: Dilated gallbladder with sludge and stones. Progressive wall thickening compared to previous. Please correlate for other clinical evidence of acute cholecystitis. If needed HIDA scan could be considered. No ductal dilatation. Electronically Signed   By: Ranell Bring M.D.   On: 03/07/2023 15:20    EKG: Not available for review   Consultant:  GI Dr. Kari and general surgery Dr. Vernetta notified from ED  Code Status: Full code  Microbiology none  Antibiotics: Rocephin  and Flagyl   Family Communication:  Patients' condition and plan of care including tests being ordered have been discussed with the patient and  patient's spouse at bedside who indicate understanding and agree with the plan.   Status is: Inpatient  Severity of Illness: The appropriate patient status for this patient is INPATIENT. Inpatient status is judged to be reasonable and necessary in order to provide the required intensity of service to ensure the patient's safety. The patient's presenting symptoms, physical exam findings, and initial radiographic and laboratory data in the context of their chronic comorbidities is felt to place them at high risk for further clinical deterioration. Furthermore, it is not anticipated that the patient will be medically stable for discharge from the hospital within 2 midnights of admission.  I certify that at the  point of admission it is my clinical judgment that the patient will require inpatient hospital care spanning beyond 2 midnights from the point of admission due to high intensity of service, high risk for further deterioration and high frequency of surveillance required.*  Signed, Vernal Alstrom, MD [facility] 03/07/2023

## 2023-03-07 NOTE — Hospital Course (Addendum)
 Gregory Torres is a 58 year old man presenting with abdominal pain. He was admitted for acute cholecystitis with LFT elevation.  Seen by gastroenterology and general surgery.  Patient underwent laparoscopic cholecystectomy on 1/7, intraoperative cholangiogram was unable to be performed to completely rule out a CBD stone.  LFTs were downtrending on 1/8 but patient will need to follow-up with his primary care physician to ensure ongoing downtrend.  On evaluation 1/8 patient was meeting his postoperative milestones with adequate pain control, having flatulence, and tolerating diet.  He will be discharging home today to follow-up outpatient with PCP and general surgery.

## 2023-03-07 NOTE — ED Provider Notes (Signed)
 [facility] EMERGENCY DEPARTMENT AT [facility] Provider Note   CSN: [id number] Arrival date & time: 03/07/23  1320     History  Chief Complaint  Patient presents with   Abdominal Pain    Gregory Torres is a 58 y.o. male.  HPI 58 year old male presents with continued abdominal pain.  Symptoms started on 1/3.  He went to [facility] and had a CT scan, ultrasound, and was given IV pain control.  He was feeling well enough to be discharged.  He was told he had gallbladder disease.  Throughout the weekend he had continued discomfort in his epigastrium that at times gets more severe.  Seems to be relatively controlled with the Vicodin and Zofran  but then this morning around 10 AM he got a lot worse and he had to take an extra Vicodin earlier than scheduled.  The pain is not as bad currently.  No fevers though he keeps feeling hot and chills.  No chest pain or shortness of breath though it hurts to take a deep breath.  Home Medications Prior to Admission medications   Medication Sig Start Date End Date Taking? Authorizing Provider  amLODipine  (NORVASC ) 10 MG tablet Take 1 tablet (10 mg total) by mouth daily. 10/17/20 03/07/23 Yes O'Neal, Darryle Ned, MD  carvedilol  (COREG ) 12.5 MG tablet Take 1 tablet (12.5 mg total) by mouth 2 (two) times daily. 10/01/22 03/07/23 Yes O'Neal, Darryle Ned, MD  Evolocumab  (REPATHA  SURECLICK) 140 MG/ML SOAJ Inject 140 mg into the skin every 14 (fourteen) days. 10/20/22  Yes O'Neal, Darryle Ned, MD  ezetimibe  (ZETIA ) 10 MG tablet Take 10 mg by mouth daily. 08/19/22  Yes [provider]  HYDROcodone -acetaminophen  (NORCO/VICODIN) 5-325 MG tablet Take 1-2 tablets by mouth every 6 (six) hours as needed for moderate pain (pain score 4-6). 03/05/23  Yes Lenor Hollering, MD  ibuprofen (HM IBUPROFEN) 200 MG tablet Take 200-400 mg by mouth every 6 (six) hours as needed for mild pain (pain score 1-3) or headache.   Yes [provider]  ondansetron   (ZOFRAN -ODT) 4 MG disintegrating tablet Take 1 tablet (4 mg total) by mouth 4 (four) times daily as needed for nause / vomiting Patient taking differently: Take 4 mg by mouth 4 (four) times daily as needed for nausea or vomiting (DISSOLVE ORALLY). 03/05/23  Yes Lenor Hollering, MD  pantoprazole  (PROTONIX ) 40 MG tablet Take 40 mg by mouth daily before breakfast. 08/19/22  Yes [provider]  TYLENOL  500 MG tablet Take 500-1,000 mg by mouth every 6 (six) hours as needed for mild pain (pain score 1-3) or headache.   Yes [provider]  escitalopram (LEXAPRO) 5 MG tablet Take 5 mg by mouth daily. Patient not taking: Reported on 03/07/2023    [provider]      Allergies    Losartan    Review of Systems   Review of Systems  Constitutional:  Positive for chills. Negative for fever.  Respiratory:  Negative for shortness of breath.   Cardiovascular:  Negative for chest pain.  Gastrointestinal:  Positive for abdominal pain and nausea. Negative for vomiting.    Physical Exam Updated Vital Signs BP 126/85   Pulse 98   Temp 98 F (36.7 C) (Oral)   Resp 18   Ht 5' 7 (1.702 m)   Wt 93 kg   SpO2 93%   BMI 32.11 kg/m  Physical Exam Vitals and nursing note reviewed.  Constitutional:      Appearance: He is well-developed. He  is not ill-appearing or diaphoretic.  HENT:     Head: Normocephalic and atraumatic.  Cardiovascular:     Rate and Rhythm: Normal rate and regular rhythm.     Heart sounds: Normal heart sounds.  Pulmonary:     Effort: Pulmonary effort is normal.     Breath sounds: Normal breath sounds.  Abdominal:     Palpations: Abdomen is soft.     Tenderness: There is abdominal tenderness in the epigastric area. Positive signs include Murphy's sign.  Skin:    General: Skin is warm and dry.  Neurological:     Mental Status: He is alert.     Torres Results / Procedures / Treatments   Labs (all labs ordered are listed, but only abnormal results are  displayed) Labs Reviewed  COMPREHENSIVE METABOLIC PANEL - Abnormal; Notable for the following components:      Result Value   Sodium 134 (*)    Glucose, Bld 135 (*)    Calcium  8.8 (*)    AST 412 (*)    ALT 296 (*)    Total Bilirubin 3.6 (*)    All other components within normal limits  CBC WITH DIFFERENTIAL/PLATELET - Abnormal; Notable for the following components:   WBC 11.6 (*)    Neutro Abs 10.9 (*)    Lymphs Abs 0.1 (*)    All other components within normal limits  LIPASE, BLOOD    EKG None  Radiology US  Abdomen Limited RUQ (LIVER/GB) Result Date: 03/07/2023 CLINICAL DATA:  Upper abdominal pain EXAM: ULTRASOUND ABDOMEN LIMITED RIGHT UPPER QUADRANT COMPARISON:  Ultrasound 03/05/2023.  CT scan 03/04/2023. FINDINGS: Gallbladder: Distended gallbladder. Wall thickening up to 6 mm. Layering stones and sludge. No reported sonographic Murphy's sign. However the patient as per the sonographer had received pain medication. Common bile duct: Diameter: 5 mm Liver: No focal lesion identified. Within normal limits in parenchymal echogenicity. Portal vein is patent on color Doppler imaging with normal direction of blood flow towards the liver. Other: None. IMPRESSION: Dilated gallbladder with sludge and stones. Progressive wall thickening compared to previous. Please correlate for other clinical evidence of acute cholecystitis. If needed HIDA scan could be considered. No ductal dilatation. Electronically Signed   By: Ranell Bring M.D.   On: 03/07/2023 15:20    Procedures Procedures    Medications Ordered in [facility] Medications  HYDROmorphone  (DILAUDID ) injection 1 mg (has no administration in time range)  cefTRIAXone  (ROCEPHIN ) 2 g in sodium chloride  0.9 % 100 mL IVPB (2 g Intravenous New Bag/Given 03/07/23 1639)    [facility] Course/ Medical Decision Making/ A&P                                 Medical Decision Making Amount and/or Complexity of Data Reviewed Labs: ordered.    Details: Mild  leukocytosis.  Abnormal LFTs and bilirubin which are new compared to a few days ago. Radiology: ordered and independent interpretation performed.    Details: Cholelithiasis  Risk Prescription drug management. Decision regarding hospitalization.   Presentation is consistent with acute cholecystitis.  Positive Murphy sign.  His white count is a little improved from the other day but his LFTs are worse.  Given dose of IV Rocephin . Discussed with General Surgery, Dr. Vernetta, who recommends medical admission and GI consult. Care transferred to Dr. Doretha.        Final Clinical Impression(s) / Torres Diagnoses Final diagnoses:  Acute cholecystitis  Rx / DC Orders [facility] Discharge Orders     None         Freddi Hamilton, MD 03/07/23 [phone]

## 2023-03-07 NOTE — ED Notes (Signed)
Pt transport to MRI

## 2023-03-07 NOTE — Consult Note (Signed)
 Reason for Consult:ab pain Referring Physician: Dr Sonjia Norleen Pina is an 58 y.o. male.  HPI: 52 yom who has pmh of htn, hld, gerd who went to outside er firday with ab pain. He had evaluation that showed gallstones. He was discharged.  He still had some pain but then this morning after eating developed significant epigastric pain again that has not abated.  He does not have emesis, no fever, having some flatus.  AST and ALT are now elevated.  TB is now 3.6.  US  shows stones and sludge, wall is 6mm, no reported sonographic Murphys sign.  I was asked to see him.   Past Medical History:  Diagnosis Date   Gallstones    GERD (gastroesophageal reflux disease)    Hypertension     Past Surgical History:  Procedure Laterality Date   KNEE ARTHROSCOPY      Family History  Problem Relation Age of Onset   Heart disease Father    Hyperlipidemia Brother     Social History:  reports that he has never smoked. He has never used smokeless tobacco. He reports current alcohol use. He reports that he does not use drugs.  Allergies:  Allergies  Allergen Reactions   Losartan Other (See Comments)    Caused sweating and clamminess    Medications: I have reviewed the patient's current medications.  Results for orders placed or performed during the hospital encounter of 03/07/23 (from the past 48 hours)  Comprehensive metabolic panel     Status: Abnormal   Collection Time: 03/07/23  3:25 PM  Result Value Ref Range   Sodium 134 (L) 135 - 145 mmol/L   Potassium 4.0 3.5 - 5.1 mmol/L   Chloride 98 98 - 111 mmol/L   CO2 29 22 - 32 mmol/L   Glucose, Bld 135 (H) 70 - 99 mg/dL    Comment: Glucose reference range applies only to samples taken after fasting for at least 8 hours.   BUN 11 6 - 20 mg/dL   Creatinine, Ser 8.92 0.61 - 1.24 mg/dL   Calcium  8.8 (L) 8.9 - 10.3 mg/dL   Total Protein 6.6 6.5 - 8.1 g/dL   Albumin 3.5 3.5 - 5.0 g/dL   AST 587 (H) 15 - 41 U/L   ALT 296 (H) 0 - 44 U/L    Alkaline Phosphatase 126 38 - 126 U/L   Total Bilirubin 3.6 (H) 0.0 - 1.2 mg/dL   GFR, Estimated >39 >39 mL/min    Comment: (NOTE) Calculated using the CKD-EPI Creatinine Equation (2021)    Anion gap 7 5 - 15    Comment: Performed at [facility], [address] [place], [place] [address]  Lipase, blood     Status: None   Collection Time: 03/07/23  3:25 PM  Result Value Ref Range   Lipase 34 11 - 51 U/L    Comment: Performed at [facility], [address], [place], [place] [address]  CBC with Differential     Status: Abnormal   Collection Time: 03/07/23  3:25 PM  Result Value Ref Range   WBC 11.6 (H) 4.0 - 10.5 K/uL   RBC 4.89 4.22 - 5.81 MIL/uL   Hemoglobin 15.0 13.0 - 17.0 g/dL   HCT 56.9 60.9 - 47.9 %   MCV 87.9 80.0 - 100.0 fL   MCH 30.7 26.0 - 34.0 pg   MCHC 34.9 30.0 - 36.0 g/dL   RDW 87.8 88.4 - 84.4 %   Platelets  174 150 - 400 K/uL   nRBC 0.0 0.0 - 0.2 %   Neutrophils Relative % 95 %   Neutro Abs 10.9 (H) 1.7 - 7.7 K/uL   Lymphocytes Relative 1 %   Lymphs Abs 0.1 (L) 0.7 - 4.0 K/uL   Monocytes Relative 4 %   Monocytes Absolute 0.4 0.1 - 1.0 K/uL   Eosinophils Relative 0 %   Eosinophils Absolute 0.0 0.0 - 0.5 K/uL   Basophils Relative 0 %   Basophils Absolute 0.0 0.0 - 0.1 K/uL   Immature Granulocytes 0 %   Abs Immature Granulocytes 0.05 0.00 - 0.07 K/uL    Comment: Performed at [facility], [address] [place], [place] [address]    US  Abdomen Limited RUQ (LIVER/GB) Result Date: 03/07/2023 CLINICAL DATA:  Upper abdominal pain EXAM: ULTRASOUND ABDOMEN LIMITED RIGHT UPPER QUADRANT COMPARISON:  Ultrasound 03/05/2023.  CT scan 03/04/2023. FINDINGS: Gallbladder: Distended gallbladder. Wall thickening up to 6 mm. Layering stones and sludge. No reported sonographic Murphy's sign. However the patient as per the sonographer had received pain medication. Common bile duct: Diameter: 5 mm Liver: No focal lesion  identified. Within normal limits in parenchymal echogenicity. Portal vein is patent on color Doppler imaging with normal direction of blood flow towards the liver. Other: None. IMPRESSION: Dilated gallbladder with sludge and stones. Progressive wall thickening compared to previous. Please correlate for other clinical evidence of acute cholecystitis. If needed HIDA scan could be considered. No ductal dilatation. Electronically Signed   By: Ranell Bring M.D.   On: 03/07/2023 15:20    Review of Systems  Constitutional:  Negative for fever.  Gastrointestinal:  Positive for abdominal pain and nausea. Negative for vomiting.  All other systems reviewed and are negative.  Blood pressure 126/85, pulse 98, temperature (!) 100.6 F (38.1 C), temperature source Oral, resp. rate 18, height 5' 7 (1.702 m), weight 93 kg, SpO2 93%. Physical Exam Vitals reviewed.  Constitutional:      Appearance: He is well-developed.  Eyes:     General: No scleral icterus. Cardiovascular:     Rate and Rhythm: Normal rate and regular rhythm.  Pulmonary:     Effort: Pulmonary effort is normal.  Abdominal:     Tenderness: There is abdominal tenderness in the right upper quadrant and epigastric area.  Skin:    General: Skin is warm and dry.     Capillary Refill: Capillary refill takes less than 2 seconds.     Coloration: Skin is not jaundiced.  Neurological:     General: No focal deficit present.     Mental Status: He is alert.     Assessment/Plan: Gallstones, ? choledocholithiasis -admission, may have clears until mn, npo after mn -recheck lfts in am -mrcp ordered by medicine -if mrcp negative and tb reasonable will proceed with lap chole in am -discussed plan with he and his wife  I reviewed all labs, vitals from this visit and Fridays, reviewed us  that shows gallstones and discussed with er provider Donnice Bury 03/07/2023, 6:58 PM

## 2023-03-07 NOTE — ED Triage Notes (Signed)
 Pt went to [facility] on Friday evening and diagnosed with gallstones. D/c Saturday and recommended surgery and prescribed Vicodin. Has had one at 1000 and 1245. Still 10/10 pain. BP 186/100 other VS WDL

## 2023-03-08 ENCOUNTER — Inpatient Hospital Stay (HOSPITAL_COMMUNITY): Payer: BC Managed Care – PPO | Admitting: Anesthesiology

## 2023-03-08 ENCOUNTER — Encounter (HOSPITAL_COMMUNITY): Payer: Self-pay | Admitting: Internal Medicine

## 2023-03-08 ENCOUNTER — Other Ambulatory Visit: Payer: Self-pay

## 2023-03-08 ENCOUNTER — Encounter (HOSPITAL_COMMUNITY)
Admission: EM | Disposition: A | Discharge status: Home / Self Care | Payer: Self-pay | Source: Home / Self Care | Attending: Emergency Medicine

## 2023-03-08 DIAGNOSIS — R748 Abnormal levels of other serum enzymes: Secondary | ICD-10-CM | POA: Diagnosis not present

## 2023-03-08 DIAGNOSIS — R932 Abnormal findings on diagnostic imaging of liver and biliary tract: Secondary | ICD-10-CM | POA: Diagnosis not present

## 2023-03-08 DIAGNOSIS — R7989 Other specified abnormal findings of blood chemistry: Secondary | ICD-10-CM | POA: Diagnosis not present

## 2023-03-08 DIAGNOSIS — K810 Acute cholecystitis: Secondary | ICD-10-CM | POA: Diagnosis not present

## 2023-03-08 DIAGNOSIS — I10 Essential (primary) hypertension: Secondary | ICD-10-CM | POA: Diagnosis not present

## 2023-03-08 DIAGNOSIS — R109 Unspecified abdominal pain: Secondary | ICD-10-CM | POA: Diagnosis not present

## 2023-03-08 DIAGNOSIS — K8000 Calculus of gallbladder with acute cholecystitis without obstruction: Secondary | ICD-10-CM | POA: Diagnosis not present

## 2023-03-08 HISTORY — PX: CHOLECYSTECTOMY: SHX55

## 2023-03-08 LAB — COMPREHENSIVE METABOLIC PANEL
ALT: 272 U/L — ABNORMAL HIGH (ref 0–44)
AST: 189 U/L — ABNORMAL HIGH (ref 15–41)
Albumin: 3.1 g/dL — ABNORMAL LOW (ref 3.5–5.0)
Alkaline Phosphatase: 120 U/L (ref 38–126)
Anion gap: 9 (ref 5–15)
BUN: 20 mg/dL (ref 6–20)
CO2: 26 mmol/L (ref 22–32)
Calcium: 8.4 mg/dL — ABNORMAL LOW (ref 8.9–10.3)
Chloride: 99 mmol/L (ref 98–111)
Creatinine, Ser: 1.24 mg/dL (ref 0.61–1.24)
GFR, Estimated: >60 mL/min (ref >60)
Glucose, Bld: 129 mg/dL — ABNORMAL HIGH (ref 70–99)
Potassium: 3.9 mmol/L (ref 3.5–5.1)
Sodium: 134 mmol/L — ABNORMAL LOW (ref 135–145)
Total Bilirubin: 5.6 mg/dL — ABNORMAL HIGH (ref 0.0–1.2)
Total Protein: 6.2 g/dL — ABNORMAL LOW (ref 6.5–8.1)

## 2023-03-08 LAB — CBC
HCT: 38.5 % — ABNORMAL LOW (ref 39.0–52.0)
Hemoglobin: 13.2 g/dL (ref 13.0–17.0)
MCH: 30.3 pg (ref 26.0–34.0)
MCHC: 34.3 g/dL (ref 30.0–36.0)
MCV: 88.5 fL (ref 80.0–100.0)
Platelets: 175 10*3/uL (ref 150–400)
RBC: 4.35 MIL/uL (ref 4.22–5.81)
RDW: 12.2 % (ref 11.5–15.5)
WBC: 10.2 10*3/uL (ref 4.0–10.5)
nRBC: 0 % (ref 0.0–0.2)

## 2023-03-08 LAB — MAGNESIUM: Magnesium: 1.8 mg/dL (ref 1.7–2.4)

## 2023-03-08 LAB — PROTIME-INR
INR: 1.3 — ABNORMAL HIGH (ref 0.8–1.2)
Prothrombin Time: 16.7 s — ABNORMAL HIGH (ref 11.4–15.2)

## 2023-03-08 SURGERY — LAPAROSCOPIC CHOLECYSTECTOMY WITH INTRAOPERATIVE CHOLANGIOGRAM
Anesthesia: General

## 2023-03-08 MED ORDER — FENTANYL CITRATE PF 50 MCG/ML IJ SOSY
PREFILLED_SYRINGE | INTRAMUSCULAR | Status: AC
  Filled 2023-03-08: qty 2

## 2023-03-08 MED ORDER — FENTANYL CITRATE (PF) 250 MCG/5ML IJ SOLN
INTRAMUSCULAR | Status: DC | PRN
  Administered 2023-03-08: 100 ug via INTRAVENOUS
  Administered 2023-03-08: 50 ug via INTRAVENOUS

## 2023-03-08 MED ORDER — ONDANSETRON HCL 4 MG/2ML IJ SOLN
INTRAMUSCULAR | Status: AC
  Filled 2023-03-08: qty 2

## 2023-03-08 MED ORDER — LIDOCAINE 2% (20 MG/ML) 5 ML SYRINGE
INTRAMUSCULAR | Status: DC | PRN
  Administered 2023-03-08: 60 mg via INTRAVENOUS

## 2023-03-08 MED ORDER — SODIUM CHLORIDE 0.9 % IV SOLN: 12.5000 mg | INTRAVENOUS | Status: DC | PRN

## 2023-03-08 MED ORDER — ROCURONIUM BROMIDE 10 MG/ML (PF) SYRINGE
PREFILLED_SYRINGE | INTRAVENOUS | Status: DC | PRN
  Administered 2023-03-08: 50 mg via INTRAVENOUS
  Administered 2023-03-08 (×2): 10 mg via INTRAVENOUS

## 2023-03-08 MED ORDER — HEMOSTATIC AGENTS (NO CHARGE) OPTIME
TOPICAL | Status: DC | PRN
  Administered 2023-03-08: 1

## 2023-03-08 MED ORDER — CARMEX CLASSIC LIP BALM EX OINT
TOPICAL_OINTMENT | CUTANEOUS | Status: DC | PRN
  Filled 2023-03-08: qty 10

## 2023-03-08 MED ORDER — 0.9 % SODIUM CHLORIDE (POUR BTL) OPTIME
TOPICAL | Status: DC | PRN
  Administered 2023-03-08: 1000 mL

## 2023-03-08 MED ORDER — KETOROLAC TROMETHAMINE 30 MG/ML IJ SOLN
INTRAMUSCULAR | Status: AC
  Filled 2023-03-08: qty 1

## 2023-03-08 MED ORDER — LACTATED RINGERS IV SOLN: INTRAVENOUS | Status: AC

## 2023-03-08 MED ORDER — CELECOXIB 200 MG PO CAPS
200.0000 mg | ORAL_CAPSULE | Freq: Once | ORAL | Status: AC
Start: 2023-03-08 — End: 2023-03-08
  Administered 2023-03-08: 200 mg via ORAL
  Filled 2023-03-08: qty 1

## 2023-03-08 MED ORDER — DEXAMETHASONE SODIUM PHOSPHATE 10 MG/ML IJ SOLN
INTRAMUSCULAR | Status: DC | PRN
  Administered 2023-03-08: 10 mg via INTRAVENOUS

## 2023-03-08 MED ORDER — LIDOCAINE HCL (PF) 2 % IJ SOLN
INTRAMUSCULAR | Status: AC
  Filled 2023-03-08: qty 5

## 2023-03-08 MED ORDER — FENTANYL CITRATE PF 50 MCG/ML IJ SOSY
25.0000 ug | PREFILLED_SYRINGE | INTRAMUSCULAR | Status: DC | PRN
  Administered 2023-03-08: 50 ug via INTRAVENOUS

## 2023-03-08 MED ORDER — ROCURONIUM BROMIDE 10 MG/ML (PF) SYRINGE
PREFILLED_SYRINGE | INTRAVENOUS | Status: AC
  Filled 2023-03-08: qty 10

## 2023-03-08 MED ORDER — DEXAMETHASONE SODIUM PHOSPHATE 10 MG/ML IJ SOLN
INTRAMUSCULAR | Status: AC
  Filled 2023-03-08: qty 1

## 2023-03-08 MED ORDER — CHLORHEXIDINE GLUCONATE 0.12 % MT SOLN
15.0000 mL | Freq: Once | OROMUCOSAL | Status: AC
Start: 2023-03-08 — End: 2023-03-08
  Administered 2023-03-08: 15 mL via OROMUCOSAL
  Filled 2023-03-08: qty 15

## 2023-03-08 MED ORDER — FENTANYL CITRATE (PF) 250 MCG/5ML IJ SOLN
INTRAMUSCULAR | Status: AC
  Filled 2023-03-08: qty 5

## 2023-03-08 MED ORDER — OXYCODONE HCL 5 MG PO TABS
5.0000 mg | ORAL_TABLET | Freq: Once | ORAL | Status: DC | PRN
Start: 2023-03-08 — End: 2023-03-08

## 2023-03-08 MED ORDER — AMISULPRIDE (ANTIEMETIC) 5 MG/2ML IV SOLN: 10.0000 mg | Freq: Once | INTRAVENOUS | Status: DC | PRN

## 2023-03-08 MED ORDER — BUPIVACAINE HCL (PF) 0.5 % IJ SOLN
INTRAMUSCULAR | Status: DC | PRN
  Administered 2023-03-08: 20 mL

## 2023-03-08 MED ORDER — ONDANSETRON HCL 4 MG/2ML IJ SOLN
INTRAMUSCULAR | Status: DC | PRN
  Administered 2023-03-08: 4 mg via INTRAVENOUS

## 2023-03-08 MED ORDER — BUPIVACAINE HCL (PF) 0.5 % IJ SOLN
INTRAMUSCULAR | Status: AC
  Filled 2023-03-08: qty 30

## 2023-03-08 MED ORDER — MIDAZOLAM HCL 2 MG/2ML IJ SOLN
INTRAMUSCULAR | Status: AC
Start: 2023-03-08 — End: ?
  Filled 2023-03-08: qty 2

## 2023-03-08 MED ORDER — MIDAZOLAM HCL 2 MG/2ML IJ SOLN
INTRAMUSCULAR | Status: DC | PRN
  Administered 2023-03-08: 2 mg via INTRAVENOUS

## 2023-03-08 MED ORDER — LACTATED RINGERS IR SOLN
Status: DC | PRN
Start: 2023-03-08 — End: 2023-03-08
  Administered 2023-03-08: 1000 mL

## 2023-03-08 MED ORDER — CEFAZOLIN SODIUM-DEXTROSE 2-4 GM/100ML-% IV SOLN
2.0000 g | Freq: Once | INTRAVENOUS | Status: AC
  Administered 2023-03-08: 2 g via INTRAVENOUS

## 2023-03-08 MED ORDER — SUGAMMADEX SODIUM 200 MG/2ML IV SOLN
INTRAVENOUS | Status: DC | PRN
  Administered 2023-03-08: 200 mg via INTRAVENOUS

## 2023-03-08 MED ORDER — OXYCODONE HCL 5 MG/5ML PO SOLN: 5.0000 mg | Freq: Once | ORAL | Status: DC | PRN

## 2023-03-08 MED ORDER — HYDROCODONE-ACETAMINOPHEN 5-325 MG PO TABS
1.0000 | ORAL_TABLET | Freq: Once | ORAL | Status: AC
  Administered 2023-03-08: 1 via ORAL
  Filled 2023-03-08: qty 1

## 2023-03-08 MED ORDER — CEFAZOLIN SODIUM-DEXTROSE 2-4 GM/100ML-% IV SOLN
INTRAVENOUS | Status: AC
  Filled 2023-03-08: qty 100

## 2023-03-08 MED ORDER — OXYCODONE HCL 5 MG PO TABS
5.0000 mg | ORAL_TABLET | ORAL | Status: DC | PRN
  Administered 2023-03-08: 5 mg via ORAL
  Filled 2023-03-08: qty 2
  Filled 2023-03-08: qty 1

## 2023-03-08 MED ORDER — PROPOFOL 10 MG/ML IV BOLUS
INTRAVENOUS | Status: DC | PRN
  Administered 2023-03-08: 200 mg via INTRAVENOUS

## 2023-03-08 SURGICAL SUPPLY — 31 items
APPLIER CLIP 5 13 M/L LIGAMAX5 (MISCELLANEOUS) ×1 IMPLANT
BAG COUNTER SPONGE SURGICOUNT (BAG) IMPLANT
CABLE HIGH FREQUENCY MONO STRZ (ELECTRODE) ×1 IMPLANT
CHLORAPREP W/TINT 26 (MISCELLANEOUS) ×1 IMPLANT
CLIP APPLIE 5 13 M/L LIGAMAX5 (MISCELLANEOUS) ×1 IMPLANT
COVER MAYO STAND XLG (MISCELLANEOUS) ×1 IMPLANT
DERMABOND ADVANCED .7 DNX12 (GAUZE/BANDAGES/DRESSINGS) ×1 IMPLANT
DRAPE C-ARM 42X120 X-RAY (DRAPES) IMPLANT
ELECT REM PT RETURN 15FT ADLT (MISCELLANEOUS) ×1 IMPLANT
ENDOLOOP SUT PDS II 0 18 (SUTURE) IMPLANT
GLOVE BIO SURGEON STRL SZ7.5 (GLOVE) ×1 IMPLANT
GOWN STRL REUS W/ TWL XL LVL3 (GOWN DISPOSABLE) ×1 IMPLANT
HEMOSTAT SNOW SURGICEL 2X4 (HEMOSTASIS) IMPLANT
HEMOSTAT SURGICEL 4X8 (HEMOSTASIS) IMPLANT
IRRIG SUCT STRYKERFLOW 2 WTIP (MISCELLANEOUS) ×1 IMPLANT
IRRIGATION SUCT STRKRFLW 2 WTP (MISCELLANEOUS) ×1 IMPLANT
KIT BASIN OR (CUSTOM PROCEDURE TRAY) ×1 IMPLANT
KIT TURNOVER KIT A (KITS) IMPLANT
PENCIL SMOKE EVACUATOR (MISCELLANEOUS) IMPLANT
SCISSORS LAP 5X35 DISP (ENDOMECHANICALS) ×1 IMPLANT
SET CHOLANGIOGRAPH MIX (MISCELLANEOUS) IMPLANT
SET TUBE SMOKE EVAC HIGH FLOW (TUBING) ×1 IMPLANT
SLEEVE Z-THREAD 5X100MM (TROCAR) ×2 IMPLANT
SPIKE FLUID TRANSFER (MISCELLANEOUS) ×1 IMPLANT
SUT MNCRL AB 4-0 PS2 18 (SUTURE) ×1 IMPLANT
SYS BAG RETRIEVAL 10MM (BASKET) ×1 IMPLANT
SYSTEM BAG RETRIEVAL 10MM (BASKET) ×1 IMPLANT
TOWEL OR 17X26 10 PK STRL BLUE (TOWEL DISPOSABLE) ×1 IMPLANT
TRAY LAPAROSCOPIC (CUSTOM PROCEDURE TRAY) ×1 IMPLANT
TROCAR BALLN 12MMX100 BLUNT (TROCAR) ×1 IMPLANT
TROCAR Z-THREAD OPTICAL 5X100M (TROCAR) ×1 IMPLANT

## 2023-03-08 NOTE — Anesthesia Postprocedure Evaluation (Signed)
 Anesthesia Post Note  Patient: Gregory Torres  Procedure(s) Performed: LAPAROSCOPIC CHOLECYSTECTOMY     Patient location during evaluation: PACU Anesthesia Type: General Level of consciousness: awake and alert Pain management: pain level controlled Vital Signs Assessment: post-procedure vital signs reviewed and stable Respiratory status: spontaneous breathing, nonlabored ventilation and respiratory function stable Cardiovascular status: stable and blood pressure returned to baseline Anesthetic complications: no   No notable events documented.  Last Vitals:  Vitals:   03/08/23 1400 03/08/23 1428  BP: 106/81 113/76  Pulse: 62 67  Resp: 10 12  Temp: 37.5 C 36.7 C  SpO2: 97% 94%    Last Pain:  Vitals:   03/08/23 1428  TempSrc: Oral  PainSc: 0-No pain                 Debby FORBES Like

## 2023-03-08 NOTE — Anesthesia Preprocedure Evaluation (Addendum)
 Anesthesia Evaluation  Patient identified by MRN, date of birth, ID band Patient awake    Reviewed: Allergy & Precautions, NPO status , Patient's Chart, lab work & pertinent test results, reviewed documented beta blocker date and time   History of Anesthesia Complications Negative for: history of anesthetic complications  Airway Mallampati: II  TM Distance: >3 FB Neck ROM: Full    Dental  (+) Dental Advisory Given, Teeth Intact   Pulmonary neg pulmonary ROS   Pulmonary exam normal        Cardiovascular hypertension, Pt. on medications and Pt. on home beta blockers + CAD  Normal cardiovascular exam     Neuro/Psych negative neurological ROS  negative psych ROS   GI/Hepatic Neg liver ROS,GERD  Medicated and Controlled,,  Endo/Other   Obesity   Renal/GU negative Renal ROS     Musculoskeletal  (+) Arthritis ,    Abdominal   Peds  Hematology negative hematology ROS (+)   Anesthesia Other Findings   Reproductive/Obstetrics                             Anesthesia Physical Anesthesia Plan  ASA: 2  Anesthesia Plan: General   Post-op Pain Management: Celebrex  PO (pre-op)*   Induction: Intravenous  PONV Risk Score and Plan: 2 and Treatment may vary due to age or medical condition, Ondansetron , Dexamethasone  and Midazolam   Airway Management Planned: Oral ETT  Additional Equipment: None  Intra-op Plan:   Post-operative Plan: Extubation in OR  Informed Consent: I have reviewed the patients History and Physical, chart, labs and discussed the procedure including the risks, benefits and alternatives for the proposed anesthesia with the patient or authorized representative who has indicated his/her understanding and acceptance.     Dental advisory given  Plan Discussed with: CRNA and Anesthesiologist  Anesthesia Plan Comments:         Anesthesia Quick Evaluation

## 2023-03-08 NOTE — Progress Notes (Signed)
 Progress Note     Subjective: Patient is having intermittent pain still, currently comfortable but pain is severe when occurring. Denies nausea or vomiting. Discussed plan for OR with patient and his wife on speaker phone.   Objective: Vital signs in last 24 hours: Temp:  [98 F (36.7 C)-103.5 F (39.7 C)] 103.5 F (39.7 C) (01/07 0600) Pulse Rate:  [60-98] 69 (01/07 0600) Resp:  [16-19] 16 (01/07 0600) BP: (107-175)/(73-102) 118/83 (01/07 0600) SpO2:  [93 %-100 %] 100 % (01/07 0600) Weight:  [92.2 kg-93 kg] 92.2 kg (01/07 0916)    Intake/Output from previous day: 01/06 0701 - 01/07 0700 In: -  Out: 150 [Urine:150] Intake/Output this shift: No intake/output data recorded.  PE: General: pleasant, WD, WN male who is laying in bed in NAD HEENT: sclera anicteric  Heart: regular, rate, and rhythm.   Lungs: Respiratory effort nonlabored Abd: soft, NT, ND MS: all 4 extremities are symmetrical with no cyanosis, clubbing, or edema. Skin: mild jaundice Psych: A&Ox3 with an appropriate affect.    Lab Results:  Recent Labs    03/07/23 1525 03/08/23 0315  WBC 11.6* 10.2  HGB 15.0 13.2  HCT 43.0 38.5*  PLT 174 175   BMET Recent Labs    03/07/23 1525 03/08/23 0315  NA 134* 134*  K 4.0 3.9  CL 98 99  CO2 29 26  GLUCOSE 135* 129*  BUN 11 20  CREATININE 1.07 1.24  CALCIUM  8.8* 8.4*   PT/INR Recent Labs    03/08/23 0315  LABPROT 16.7*  INR 1.3*   CMP     Component Value Date/Time   NA 134 (L) 03/08/2023 0315   NA 141 10/03/2020 0905   K 3.9 03/08/2023 0315   CL 99 03/08/2023 0315   CO2 26 03/08/2023 0315   GLUCOSE 129 (H) 03/08/2023 0315   BUN 20 03/08/2023 0315   BUN 13 10/03/2020 0905   CREATININE 1.24 03/08/2023 0315   CALCIUM  8.4 (L) 03/08/2023 0315   PROT 6.2 (L) 03/08/2023 0315   ALBUMIN 3.1 (L) 03/08/2023 0315   AST 189 (H) 03/08/2023 0315   ALT 272 (H) 03/08/2023 0315   ALKPHOS 120 03/08/2023 0315   BILITOT 5.6 (H) 03/08/2023 0315    GFRNONAA >60 03/08/2023 0315   Lipase     Component Value Date/Time   LIPASE 34 03/07/2023 1525       Studies/Results: MR ABDOMEN MRCP W WO CONTAST Result Date: 03/08/2023 CLINICAL DATA:  Cholelithiasis, abdominal pain EXAM: MRI ABDOMEN WITHOUT AND WITH CONTRAST (INCLUDING MRCP) TECHNIQUE: Multiplanar multisequence MR imaging of the abdomen was performed both before and after the administration of intravenous contrast. Heavily T2-weighted images of the biliary and pancreatic ducts were obtained, and three-dimensional MRCP images were rendered by post processing. CONTRAST:  9 mL Gadavist  gadolinium contrast IV COMPARISON:  Same-day right upper quadrant ultrasound FINDINGS: Lower chest: No acute abnormality. Hepatobiliary: No solid liver abnormality is seen. Small gallstones near the gallbladder neck as well as sludge and probable tiny stones within the gallbladder neck and cystic duct (series 23, image 17, 19). Gallbladder wall thickening and fat stranding. No gallstones within the common bile duct nor biliary ductal dilatation. Pancreas: Unremarkable. No pancreatic ductal dilatation or surrounding inflammatory changes. Spleen: Normal in size without significant abnormality. Adrenals/Urinary Tract: Adrenal glands are unremarkable. Kidneys are normal, without renal calculi, solid lesion, or hydronephrosis. Stomach/Bowel: Stomach is within normal limits. No evidence of bowel wall thickening, distention, or inflammatory changes. Vascular/Lymphatic: No significant vascular findings  are present. No enlarged abdominal lymph nodes. Other: No abdominal wall hernia or abnormality. No ascites. Musculoskeletal: No acute or significant osseous findings. IMPRESSION: 1. Small gallstones near the gallbladder neck as well as sludge and probable tiny stones within the gallbladder neck and cystic duct. Gallbladder wall thickening and fat stranding. Findings are consistent with acute cholecystitis. 2. No gallstones within  the common bile duct nor biliary ductal dilatation. Electronically Signed   By: Marolyn JONETTA Jaksch M.D.   On: 03/08/2023 07:36   US  Abdomen Limited RUQ (LIVER/GB) Result Date: 03/07/2023 CLINICAL DATA:  Upper abdominal pain EXAM: ULTRASOUND ABDOMEN LIMITED RIGHT UPPER QUADRANT COMPARISON:  Ultrasound 03/05/2023.  CT scan 03/04/2023. FINDINGS: Gallbladder: Distended gallbladder. Wall thickening up to 6 mm. Layering stones and sludge. No reported sonographic Murphy's sign. However the patient as per the sonographer had received pain medication. Common bile duct: Diameter: 5 mm Liver: No focal lesion identified. Within normal limits in parenchymal echogenicity. Portal vein is patent on color Doppler imaging with normal direction of blood flow towards the liver. Other: None. IMPRESSION: Dilated gallbladder with sludge and stones. Progressive wall thickening compared to previous. Please correlate for other clinical evidence of acute cholecystitis. If needed HIDA scan could be considered. No ductal dilatation. Electronically Signed   By: Ranell Bring M.D.   On: 03/07/2023 15:20    Anti-infectives: Anti-infectives (From admission, onward)    Start     Dose/Rate Route Frequency Ordered Stop   03/08/23 1600  cefTRIAXone  (ROCEPHIN ) 2 g in sodium chloride  0.9 % 100 mL IVPB        2 g 200 mL/hr over 30 Minutes Intravenous Every 24 hours 03/07/23 1746     03/07/23 1800  metroNIDAZOLE  (FLAGYL ) IVPB 500 mg        500 mg 100 mL/hr over 60 Minutes Intravenous Every 12 hours 03/07/23 1746     03/07/23 1630  cefTRIAXone  (ROCEPHIN ) 2 g in sodium chloride  0.9 % 100 mL IVPB        2 g 200 mL/hr over 30 Minutes Intravenous  Once 03/07/23 1618 03/07/23 1817        Assessment/Plan Cholelithiasis Elevated LFTs with hyperbilirubinemia  Probable cholecystitis  - US  yesterday with progressive gallbladder wall thickening from 1/4  - leukocytosis improved from 1/4, Afebrile and HD stable - Tbili 5.6 from 3.6, MRCP negative  for choledocholithiasis - ?elevation in LFTs and Tbili secondary to Mirizzi syndrome - plan lap chole with IOC today - I have explained the procedure, risks, and aftercare of Laparoscopic cholecystectomy  with IOC.  Risks include but are not limited to anesthesia (MI, CVA, death, prolonged intubation and aspiration), bleeding, infection, wound problems, hernia, bile leak, injury to common bile duct/liver/intestine, possible need for subtotal cholecystectomy or open cholecystectomy, increased risk of DVT/PE and diarrhea post op.  He seems to understand and agrees to proceed.   FEN: NPO, IVF@100cc /h VTE: SCDs ID: [email or web address] /[email or web address]   LOS: 1 day   I reviewed hospitalist notes, last 24 h vitals and pain scores, last 48 h intake and output, last 24 h labs and trends, and last 24 h imaging results.   Burnard JONELLE Louder, [facility] 03/08/2023, 9:21 AM Please see Amion for pager number during day hours 7:00am-4:30pm

## 2023-03-08 NOTE — Op Note (Signed)
 LAPAROSCOPIC CHOLECYSTECTOMY  Procedure Note  Gregory Torres 03/07/2023 - 03/08/2023   Pre-op Diagnosis: acute cholecystitis with cholelithiasis     Post-op Diagnosis: same  Procedure(s): LAPAROSCOPIC CHOLECYSTECTOMY  Surgeon(s): Vernetta Berg, MD  Anesthesia: Choice  Staff:  Circulator: Manalo, Maelin O, RN Scrub Person: Johnson Jerrold ORN  Estimated Blood Loss: Minimal               Specimens: sent to path  Indications: This is a 58 year old gentleman who presented with right upper quadrant abdominal pain.  On his initial presentation he had normal liver function test.  He was sent home to the emergency department returned service later with worsening discomfort.  At this point is found to have a bilirubin about 3.  He was admitted and MRCP was performed which showed no distal bile duct obstruction.  His bilirubin had increased to about 5.  Given the MRCP results decision was made to proceed to the operating room for a laparoscopic cholecystectomy with possible cholangiogram  Findings: The patient was found to have a severely inflamed gallbladder with what appeared to be a stone in the neck of the gallbladder creating hydrops of the gallbladder.  It was very woody at the base.  I was able to achieve a critical view around the cystic duct but felt it was unsafe to try cholangiogram as it was so foreshortened.  Procedure: The patient was brought to the operating identifies correct patient.  He is placed upon the operating table and general anesthesia was induced.  His abdomen was prepped and draped in the usual sterile fashion.  I made a small vertical incision below the umbilicus with a scalpel.  I carried this down to the fascia which was then opened the scalpel.  We then gained entrance to the abdominal cavity under direct vision.  A 0 Vicryl pursestring suture was placed around the fascial opening.  The [facility] port was placed at the opening and insufflation of the abdomen was began.   I placed a 5 mm trocar in the patient's epigastrium and 2 more in the right upper quadrant all under direct vision.  The gallbladder was found to be acutely inflamed with omentum stuck to it.  I was able to grasp the top of the gallbladder and lifted and then slowly peel the omentum off the gallbladder.  The base of the gallbladder was involved with a phlegmon and was quite firm with what seemed to be a stone in the neck.  I was able to dissect medial and lateral on the gallbladder at the liver bed after freed up slightly further.  I then attempted to dissect out the cystic duct and cystic artery.  I was able to identify the duct and the artery.  I was able to easily clipped artery 3 times proximally once distally and transected.  The cystic artery was very woody and foreshortened.  At this point I do not believe I can safely do a cholangiogram.  I placed 2 surgical clips proximally on the cystic duct and then transected it distally at the gallbladder with the laparoscopic scissors.  I can see the very tiny cystic duct opening in the phlegmon of the cystic duct stump.  I placed an Endoloop on this as well.  I then slowly dissected the gallbladder free from the liver bed with electrocautery.  The gallbladder was entered during this and several stones were removed.  I believe I was able to get the entire back wall off of the gallbladder fossa  with a dissection as well with the cautery.  Once the gallbladder was removed I then placed in an Endosac along with the stones.  I achieved hemostasis in the gallbladder fossa with the cautery and placed a piece of surgical snow lower in the fossa as well for hemostasis.  I then placed the gallbladder in an Endosac and removed it through the incision at the umbilicus.  I then tied the 0 Vicryl at the place of the umbilicus closing the fascial defect.  We then copiously irrigated the right upper quadrant with normal saline.  Again hemostasis appeared to be achieved.  At this  point all ports were removed under direct vision and the abdomen was deflated.  All incisions were then anesthetized with Marcaine  and closed with 4-0 Monocryl sutures.  Dermabond was then applied.  The patient tolerated the procedure well.  All the counts were correct at the end of the procedure.  The patient was then extubated in the operating room and taken in a stable condition to the recovery room.          Gregory Torres   Date: 03/08/2023  Time: 1:22 PM

## 2023-03-08 NOTE — Transfer of Care (Signed)
 Immediate Anesthesia Transfer of Care Note  Patient: Gregory Torres  Procedure(s) Performed: LAPAROSCOPIC CHOLECYSTECTOMY  Patient Location: PACU  Anesthesia Type:General  Level of Consciousness: awake, drowsy, and responds to stimulation  Airway & Oxygen Therapy: Patient Spontanous Breathing and Patient connected to face mask oxygen  Post-op Assessment: Report given to RN and Post -op Vital signs reviewed and stable  Post vital signs: Reviewed and stable  Last Vitals:  Vitals Value Taken Time  BP 106/71 03/08/23 1330  Temp    Pulse 64 03/08/23 1331  Resp 9 03/08/23 1330  SpO2 98 % 03/08/23 1331  Vitals shown include unfiled device data.  Last Pain:  Vitals:   03/08/23 1117  TempSrc: Oral  PainSc: 0-No pain      Patients Stated Pain Goal: 5 (03/08/23 1117)  Complications: No notable events documented.

## 2023-03-08 NOTE — Anesthesia Procedure Notes (Signed)
 Procedure Name: Intubation Date/Time: 03/08/2023 12:21 PM  Performed by: Dasie Nena PARAS, CRNAPre-anesthesia Checklist: Patient identified, Emergency Drugs available, Suction available, Patient being monitored and Timeout performed Patient Re-evaluated:Patient Re-evaluated prior to induction Oxygen Delivery Method: Circle system utilized Preoxygenation: Pre-oxygenation with 100% oxygen Induction Type: IV induction Ventilation: Mask ventilation without difficulty Laryngoscope Size: Miller and 3 Grade View: Grade II Tube type: Oral Tube size: 7.5 mm Number of attempts: 1 Airway Equipment and Method: Stylet Placement Confirmation: ETT inserted through vocal cords under direct vision, positive ETCO2 and breath sounds checked- equal and bilateral Secured at: 23 cm Tube secured with: Tape Dental Injury: Teeth and Oropharynx as per pre-operative assessment  Comments: Grade II view with external neck pressure

## 2023-03-08 NOTE — Consult Note (Signed)
 [facility] Consultation Note  Referring Provider: [facility] Primary Care Physician:  Marvene Prentice SAUNDERS, FNP Primary Gastroenterologist:  Dr. Elicia  Reason for Consultation:  abdominal pain  HPI: Gregory Torres is a 58 y.o. male presenting abdominal pain with imaging showing gallstones with cholecystitis.  Similar isolated episodes of pain over the past few years, much worse yesterday.  Improving now.  MRCP no obvious choledocholithiasis.  LFTs are elevated.   Past Medical History:  Diagnosis Date   Gallstones    GERD (gastroesophageal reflux disease)    Hypertension     Past Surgical History:  Procedure Laterality Date   KNEE ARTHROSCOPY      Prior to Admission medications   Medication Sig Start Date End Date Taking? Authorizing Provider  amLODipine  (NORVASC ) 10 MG tablet Take 1 tablet (10 mg total) by mouth daily. 10/17/20 03/07/23 Yes O'Neal, Darryle Ned, MD  carvedilol  (COREG ) 12.5 MG tablet Take 1 tablet (12.5 mg total) by mouth 2 (two) times daily. 10/01/22 03/07/23 Yes O'Neal, Darryle Ned, MD  Evolocumab  (REPATHA  SURECLICK) 140 MG/ML SOAJ Inject 140 mg into the skin every 14 (fourteen) days. 10/20/22  Yes O'Neal, Darryle Ned, MD  ezetimibe  (ZETIA ) 10 MG tablet Take 10 mg by mouth daily. 08/19/22  Yes [provider]  HYDROcodone -acetaminophen  (NORCO/VICODIN) 5-325 MG tablet Take 1-2 tablets by mouth every 6 (six) hours as needed for moderate pain (pain score 4-6). 03/05/23  Yes Lenor Hollering, MD  ibuprofen (HM IBUPROFEN) 200 MG tablet Take 200-400 mg by mouth every 6 (six) hours as needed for mild pain (pain score 1-3) or headache.   Yes [provider]  ondansetron  (ZOFRAN -ODT) 4 MG disintegrating tablet Take 1 tablet (4 mg total) by mouth 4 (four) times daily as needed for nause / vomiting Patient taking differently: Take 4 mg by mouth 4 (four) times daily as needed for nausea or vomiting (DISSOLVE ORALLY). 03/05/23  Yes Lenor Hollering, MD   pantoprazole  (PROTONIX ) 40 MG tablet Take 40 mg by mouth daily before breakfast. 08/19/22  Yes [provider]  TYLENOL  500 MG tablet Take 500-1,000 mg by mouth every 6 (six) hours as needed for mild pain (pain score 1-3) or headache.   Yes [provider]  escitalopram (LEXAPRO) 5 MG tablet Take 5 mg by mouth daily. Patient not taking: Reported on 03/07/2023    [provider]    Current Facility-Administered Medications  Medication Dose Route Frequency Provider Last Rate Last Admin   0.9 %  sodium chloride  infusion   Intravenous Continuous Pokhrel, Laxman, MD 100 mL/hr at 03/08/23 0654 New Bag at 03/08/23 0654   [MAR Hold] 0.9 %  sodium chloride  infusion  250 mL Intravenous PRN Pokhrel, Laxman, MD       [MAR Hold] acetaminophen  (TYLENOL ) tablet 650 mg  650 mg Oral Q6H PRN Pokhrel, Laxman, MD   650 mg at 03/08/23 0836   Or   [MAR Hold] acetaminophen  (TYLENOL ) suppository 650 mg  650 mg Rectal Q6H PRN Pokhrel, Laxman, MD       [MAR Hold] amLODipine  (NORVASC ) tablet 10 mg  10 mg Oral Daily Pokhrel, Laxman, MD       [MAR Hold] carvedilol  (COREG ) tablet 12.5 mg  12.5 mg Oral BID Pokhrel, Laxman, MD   12.5 mg at 03/08/23 0923   [MAR Hold] cefTRIAXone  (ROCEPHIN ) 2 g in sodium chloride  0.9 % 100 mL IVPB  2 g Intravenous Q24H Pokhrel, Laxman, MD       chlorhexidine  (PERIDEX ) 0.12 % solution 15  mL  15 mL Mouth/Throat Once Lucious Debby BRAVO, MD       [facility]  Hold] hydrALAZINE  (APRESOLINE ) injection 5 mg  5 mg Intravenous Q6H PRN Pokhrel, Laxman, MD       [MAR Hold] HYDROmorphone  (DILAUDID ) injection 1 mg  1 mg Intravenous Q3H PRN Pokhrel, Laxman, MD       lactated ringers  infusion   Intravenous Continuous Lucious Debby BRAVO, MD       [MAR Hold] lip balm (CARMEX) ointment   Topical PRN Jadine Toribio SQUIBB, MD   Given at 03/08/23 0932   [MAR Hold] metroNIDAZOLE  (FLAGYL ) IVPB 500 mg  500 mg Intravenous Q12H Pokhrel, Laxman, MD   Stopped at 03/07/23 1918   [MAR Hold] ondansetron  (ZOFRAN )  injection 4 mg  4 mg Intravenous Q6H PRN Pokhrel, Laxman, MD       [MAR Hold] pantoprazole  (PROTONIX ) injection 40 mg  40 mg Intravenous Q24H Pokhrel, Laxman, MD   40 mg at 03/07/23 2110   [facility] Hold] sodium chloride  flush (NS) 0.9 % injection 3 mL  3 mL Intravenous Q12H Pokhrel, Laxman, MD   3 mL at 03/07/23 2111   [facility] Hold] sodium chloride  flush (NS) 0.9 % injection 3 mL  3 mL Intravenous PRN Pokhrel, Laxman, MD        Allergies as of 03/07/2023 - Review Complete 03/07/2023  Allergen Reaction Noted   Losartan Other (See Comments) 03/07/2023    Family History  Problem Relation Age of Onset   Heart disease Father    Hyperlipidemia Brother     Social History   Socioeconomic History   Marital status: Married    Spouse name: Not on file   Number of children: 1   Years of education: Not on file   Highest education level: Not on file  Occupational History   Occupation: Theatre stage manager  Tobacco Use   Smoking status: Never   Smokeless tobacco: Never  Substance and Sexual Activity   Alcohol use: Yes   Drug use: Never   Sexual activity: Not on file  Other Topics Concern   Not on file  Social History Narrative   Not on file   Social Drivers of Health   Financial Resource Strain: Not on file  Food Insecurity: Not on file  Transportation Needs: Not on file  Physical Activity: Not on file  Stress: Not on file  Social Connections: Not on file  Intimate Partner Violence: Not on file    Review of Systems: As per HPI, all others negative  Physical Exam: Vital signs in last 24 hours: Temp:  [98 F (36.7 C)-103.5 F (39.7 C)] 98.3 F (36.8 C) (01/07 1117) Pulse Rate:  [60-98] 66 (01/07 1117) Resp:  [16-19] 18 (01/07 1117) BP: (107-175)/(71-102) 107/71 (01/07 1117) SpO2:  [93 %-100 %] 95 % (01/07 1117) Weight:  [92.2 kg-93 kg] 92.2 kg (01/07 0916)   General:   Alert,  Well-developed, well-nourished, pleasant and cooperative in NAD Head:  Normocephalic and  atraumatic. Eyes:  Sclera icteric bilaterally,   Conjunctiva pink. Ears:  Normal auditory acuity. Nose:  No deformity, discharge,  or lesions. Mouth:  No deformity or lesions.  Oropharynx pink & moist. Neck:  Supple; no masses or thyromegaly. Lungs:  No visible respiratory distress Abdomen:  Soft, nontender and nondistended. No masses, hepatosplenomegaly or hernias noted. Normal bowel sounds, without guarding, and without rebound.     Msk:  Symmetrical without gross deformities. Normal posture. Pulses:  Normal pulses noted. Extremities:  Without clubbing or edema.  Neurologic:  Alert and  oriented x4;  grossly normal neurologically. Skin:  Intact without significant lesions or rashes. Psych:  Alert and cooperative. Normal mood and affect.   Lab Results: Recent Labs    03/07/23 1525 03/08/23 0315  WBC 11.6* 10.2  HGB 15.0 13.2  HCT 43.0 38.5*  PLT 174 175   BMET Recent Labs    03/07/23 1525 03/08/23 0315  NA 134* 134*  K 4.0 3.9  CL 98 99  CO2 29 26  GLUCOSE 135* 129*  BUN 11 20  CREATININE 1.07 1.24  CALCIUM  8.8* 8.4*   LFT Recent Labs    03/08/23 0315  PROT 6.2*  ALBUMIN 3.1*  AST 189*  ALT 272*  ALKPHOS 120  BILITOT 5.6*   PT/INR Recent Labs    03/08/23 0315  LABPROT 16.7*  INR 1.3*    Studies/Results: MR ABDOMEN MRCP W WO CONTAST Result Date: 03/08/2023 CLINICAL DATA:  Cholelithiasis, abdominal pain EXAM: MRI ABDOMEN WITHOUT AND WITH CONTRAST (INCLUDING MRCP) TECHNIQUE: Multiplanar multisequence MR imaging of the abdomen was performed both before and after the administration of intravenous contrast. Heavily T2-weighted images of the biliary and pancreatic ducts were obtained, and three-dimensional MRCP images were rendered by post processing. CONTRAST:  9 mL Gadavist  gadolinium contrast IV COMPARISON:  Same-day right upper quadrant ultrasound FINDINGS: Lower chest: No acute abnormality. Hepatobiliary: No solid liver abnormality is seen. Small gallstones  near the gallbladder neck as well as sludge and probable tiny stones within the gallbladder neck and cystic duct (series 23, image 17, 19). Gallbladder wall thickening and fat stranding. No gallstones within the common bile duct nor biliary ductal dilatation. Pancreas: Unremarkable. No pancreatic ductal dilatation or surrounding inflammatory changes. Spleen: Normal in size without significant abnormality. Adrenals/Urinary Tract: Adrenal glands are unremarkable. Kidneys are normal, without renal calculi, solid lesion, or hydronephrosis. Stomach/Bowel: Stomach is within normal limits. No evidence of bowel wall thickening, distention, or inflammatory changes. Vascular/Lymphatic: No significant vascular findings are present. No enlarged abdominal lymph nodes. Other: No abdominal wall hernia or abnormality. No ascites. Musculoskeletal: No acute or significant osseous findings. IMPRESSION: 1. Small gallstones near the gallbladder neck as well as sludge and probable tiny stones within the gallbladder neck and cystic duct. Gallbladder wall thickening and fat stranding. Findings are consistent with acute cholecystitis. 2. No gallstones within the common bile duct nor biliary ductal dilatation. Electronically Signed   By: Marolyn JONETTA Jaksch M.D.   On: 03/08/2023 07:36   US  Abdomen Limited RUQ (LIVER/GB) Result Date: 03/07/2023 CLINICAL DATA:  Upper abdominal pain EXAM: ULTRASOUND ABDOMEN LIMITED RIGHT UPPER QUADRANT COMPARISON:  Ultrasound 03/05/2023.  CT scan 03/04/2023. FINDINGS: Gallbladder: Distended gallbladder. Wall thickening up to 6 mm. Layering stones and sludge. No reported sonographic Murphy's sign. However the patient as per the sonographer had received pain medication. Common bile duct: Diameter: 5 mm Liver: No focal lesion identified. Within normal limits in parenchymal echogenicity. Portal vein is patent on color Doppler imaging with normal direction of blood flow towards the liver. Other: None. IMPRESSION:  Dilated gallbladder with sludge and stones. Progressive wall thickening compared to previous. Please correlate for other clinical evidence of acute cholecystitis. If needed HIDA scan could be considered. No ductal dilatation. Electronically Signed   By: Ranell Bring M.D.   On: 03/07/2023 15:20    Impression:   Gallstones with acute cholecystitis. LFT elevation but no biliary ductal dilatation or choledocholithiasis on MRCP.  Plan:   Cholecystectomy today by surgeons with IOC. ERCP only if  significant findings on intraoperative cholangiogram. [facility] will follow at a distance.   LOS: 1 day   Leela Vanbrocklin M  03/08/2023, 11:21 AM  Cell [phone] If no answer or after 5 PM call [phone]

## 2023-03-08 NOTE — Progress Notes (Signed)
  Progress Note   Patient: Gregory Torres FMW:[id number] DOB: March 25, 1965 DOA: 03/07/2023     1 DOS: the patient was seen and examined on 03/08/2023   Brief hospital course: 58 year old man presenting with abdominal pain.  Admitted for acute cholecystitis with LFT elevation.  Seen by gastroenterology and general surgery.  Status postcholecystectomy 1/7.  Follow LFTs.  Consultants General surgery GI  Procedures/Events 1/7 laparoscopic cholecystectomy  Assessment and Plan: Acute gallstone cholecystitis Elevated transaminases Elevated total bilirubin MRCP no CBD stones.  Status post cholecystectomy.  Continue management per general surgery.  Repeat LFTs in a.m, if not improved may need to consider ERCP.  Essential hypertension Stable.  On amlodipine  and Coreg  at home.     Hyperlipidemia On Repatha  and ezetimibe  at home.  Will hold for now.  Intolerant to statins.   GERD.  PPI.      Subjective:  Post-op pain  Physical Exam: Vitals:   03/08/23 1330 03/08/23 1345 03/08/23 1400 03/08/23 1428  BP: 106/70 102/72 106/81 113/76  Pulse: 64 68 62 67  Resp: 10 10 10 12   Temp: 99.5 F (37.5 C)  99.5 F (37.5 C) 98.1 F (36.7 C)  TempSrc:    Oral  SpO2: 99% 93% 97% 94%  Weight:      Height:       Physical Exam Vitals reviewed.  Constitutional:      General: He is not in acute distress.    Appearance: He is not ill-appearing or toxic-appearing.  Cardiovascular:     Rate and Rhythm: Normal rate and regular rhythm.     Heart sounds: No murmur heard. Pulmonary:     Effort: Pulmonary effort is normal. No respiratory distress.     Breath sounds: No wheezing, rhonchi or rales.  Neurological:     Mental Status: He is alert.  Psychiatric:        Mood and Affect: Mood normal.        Behavior: Behavior normal.     Data Reviewed: Basic metabolic panel unremarkable AST and ALT trending down.  Total bilirubin up to 5.6 CBC unremarkable with resolution of leukocytosis.  Family  Communication: none  Disposition: Status is: Inpatient Remains inpatient appropriate because: s/p surgery     Time spent: 20 minutes  Author: Toribio Door, MD 03/08/2023 5:39 PM  For on call review [email or web address].

## 2023-03-09 ENCOUNTER — Encounter (HOSPITAL_COMMUNITY): Payer: Self-pay | Admitting: Surgery

## 2023-03-09 DIAGNOSIS — K810 Acute cholecystitis: Secondary | ICD-10-CM | POA: Diagnosis not present

## 2023-03-09 LAB — COMPREHENSIVE METABOLIC PANEL
ALT: 184 U/L — ABNORMAL HIGH (ref 0–44)
AST: 110 U/L — ABNORMAL HIGH (ref 15–41)
Albumin: 2.8 g/dL — ABNORMAL LOW (ref 3.5–5.0)
Alkaline Phosphatase: 121 U/L (ref 38–126)
Anion gap: 9 (ref 5–15)
BUN: 18 mg/dL (ref 6–20)
CO2: 26 mmol/L (ref 22–32)
Calcium: 8.4 mg/dL — ABNORMAL LOW (ref 8.9–10.3)
Chloride: 96 mmol/L — ABNORMAL LOW (ref 98–111)
Creatinine, Ser: 1.1 mg/dL (ref 0.61–1.24)
GFR, Estimated: >60 mL/min (ref >60)
Glucose, Bld: 169 mg/dL — ABNORMAL HIGH (ref 70–99)
Potassium: 4 mmol/L (ref 3.5–5.1)
Sodium: 131 mmol/L — ABNORMAL LOW (ref 135–145)
Total Bilirubin: 4.6 mg/dL — ABNORMAL HIGH (ref 0.0–1.2)
Total Protein: 6.1 g/dL — ABNORMAL LOW (ref 6.5–8.1)

## 2023-03-09 LAB — CBC
HCT: 37.3 % — ABNORMAL LOW (ref 39.0–52.0)
Hemoglobin: 12.8 g/dL — ABNORMAL LOW (ref 13.0–17.0)
MCH: 30.5 pg (ref 26.0–34.0)
MCHC: 34.3 g/dL (ref 30.0–36.0)
MCV: 88.8 fL (ref 80.0–100.0)
Platelets: 188 10*3/uL (ref 150–400)
RBC: 4.2 MIL/uL — ABNORMAL LOW (ref 4.22–5.81)
RDW: 12.4 % (ref 11.5–15.5)
WBC: 7 10*3/uL (ref 4.0–10.5)
nRBC: 0 % (ref 0.0–0.2)

## 2023-03-09 LAB — SURGICAL PATHOLOGY

## 2023-03-09 NOTE — Progress Notes (Signed)
 1 Day Post-Op   Subjective/Chief Complaint: Patient reports no pain this morning Tolerated liquids   Objective: Vital signs in last 24 hours: Temp:  [97.3 F (36.3 C)-99.5 F (37.5 C)] 97.3 F (36.3 C) (01/08 0354) Pulse Rate:  [55-80] 55 (01/08 0354) Resp:  [10-18] 18 (01/08 0354) BP: (102-123)/(70-91) 123/91 (01/08 0354) SpO2:  [92 %-99 %] 94 % (01/08 0354) Weight:  [92.2 kg-96.5 kg] 96.5 kg (01/08 0349)    Intake/Output from previous day: 01/07 0701 - 01/08 0700 In: 1659.3 [P.O.:480; I.V.:979.3; IV Piggyback:200] Out: 10 [Blood:10] Intake/Output this shift: No intake/output data recorded.  Exam: Awake and alert Looks comfortable Abdomen soft, minimally tender, incisions clean Less jaundice than yesterday  Lab Results:  Recent Labs    03/08/23 0315 03/09/23 0427  WBC 10.2 7.0  HGB 13.2 12.8*  HCT 38.5* 37.3*  PLT 175 188   BMET Recent Labs    03/08/23 0315 03/09/23 0427  NA 134* 131*  K 3.9 4.0  CL 99 96*  CO2 26 26  GLUCOSE 129* 169*  BUN 20 18  CREATININE 1.24 1.10  CALCIUM  8.4* 8.4*   PT/INR Recent Labs    03/08/23 0315  LABPROT 16.7*  INR 1.3*   ABG No results for input(s): PHART, HCO3 in the last 72 hours.  Invalid input(s): PCO2, PO2  Studies/Results: MR ABDOMEN MRCP W WO CONTAST Result Date: 03/08/2023 CLINICAL DATA:  Cholelithiasis, abdominal pain EXAM: MRI ABDOMEN WITHOUT AND WITH CONTRAST (INCLUDING MRCP) TECHNIQUE: Multiplanar multisequence MR imaging of the abdomen was performed both before and after the administration of intravenous contrast. Heavily T2-weighted images of the biliary and pancreatic ducts were obtained, and three-dimensional MRCP images were rendered by post processing. CONTRAST:  9 mL Gadavist  gadolinium contrast IV COMPARISON:  Same-day right upper quadrant ultrasound FINDINGS: Lower chest: No acute abnormality. Hepatobiliary: No solid liver abnormality is seen. Small gallstones near the gallbladder neck as  well as sludge and probable tiny stones within the gallbladder neck and cystic duct (series 23, image 17, 19). Gallbladder wall thickening and fat stranding. No gallstones within the common bile duct nor biliary ductal dilatation. Pancreas: Unremarkable. No pancreatic ductal dilatation or surrounding inflammatory changes. Spleen: Normal in size without significant abnormality. Adrenals/Urinary Tract: Adrenal glands are unremarkable. Kidneys are normal, without renal calculi, solid lesion, or hydronephrosis. Stomach/Bowel: Stomach is within normal limits. No evidence of bowel wall thickening, distention, or inflammatory changes. Vascular/Lymphatic: No significant vascular findings are present. No enlarged abdominal lymph nodes. Other: No abdominal wall hernia or abnormality. No ascites. Musculoskeletal: No acute or significant osseous findings. IMPRESSION: 1. Small gallstones near the gallbladder neck as well as sludge and probable tiny stones within the gallbladder neck and cystic duct. Gallbladder wall thickening and fat stranding. Findings are consistent with acute cholecystitis. 2. No gallstones within the common bile duct nor biliary ductal dilatation. Electronically Signed   By: Marolyn JONETTA Jaksch M.D.   On: 03/08/2023 07:36   US  Abdomen Limited RUQ (LIVER/GB) Result Date: 03/07/2023 CLINICAL DATA:  Upper abdominal pain EXAM: ULTRASOUND ABDOMEN LIMITED RIGHT UPPER QUADRANT COMPARISON:  Ultrasound 03/05/2023.  CT scan 03/04/2023. FINDINGS: Gallbladder: Distended gallbladder. Wall thickening up to 6 mm. Layering stones and sludge. No reported sonographic Murphy's sign. However the patient as per the sonographer had received pain medication. Common bile duct: Diameter: 5 mm Liver: No focal lesion identified. Within normal limits in parenchymal echogenicity. Portal vein is patent on color Doppler imaging with normal direction of blood flow towards the liver. Other: None.  IMPRESSION: Dilated gallbladder with sludge and  stones. Progressive wall thickening compared to previous. Please correlate for other clinical evidence of acute cholecystitis. If needed HIDA scan could be considered. No ductal dilatation. Electronically Signed   By: Ranell Bring M.D.   On: 03/07/2023 15:20    Anti-infectives: Anti-infectives (From admission, onward)    Start     Dose/Rate Route Frequency Ordered Stop   03/08/23 1600  cefTRIAXone  (ROCEPHIN ) 2 g in sodium chloride  0.9 % 100 mL IVPB        2 g 200 mL/hr over 30 Minutes Intravenous Every 24 hours 03/07/23 1746     03/08/23 1215  ceFAZolin  (ANCEF ) IVPB 2g/100 mL premix        2 g 200 mL/hr over 30 Minutes Intravenous  Once 03/08/23 1207 03/08/23 1237   03/08/23 1205  ceFAZolin  (ANCEF ) 2-4 GM/100ML-% IVPB       Note to Pharmacy: Dasie Pool J: cabinet override      03/08/23 1205 03/08/23 1229   03/07/23 1800  metroNIDAZOLE  (FLAGYL ) IVPB 500 mg  Status:  Discontinued        500 mg 100 mL/hr over 60 Minutes Intravenous Every 12 hours 03/07/23 1746 03/08/23 1356   03/07/23 1630  cefTRIAXone  (ROCEPHIN ) 2 g in sodium chloride  0.9 % 100 mL IVPB        2 g 200 mL/hr over 30 Minutes Intravenous  Once 03/07/23 1618 03/07/23 1817       Assessment/Plan: POD#1 s/p lap chole  -I was unable to perform a cholangiogram yesterday to completely r/o a CBD stone.  Clinically he is better and his LFT's have improved. -will advance diet.  He may be able to go home this afternoon if tolerating PO.  He would need repeat LFTs in 1 week to confirm improvement  LOS: 2 days    Vicenta Poli 03/09/2023

## 2023-03-09 NOTE — TOC CM/SW Note (Signed)
 Transition of Care [facility]) - Inpatient Brief Assessment   Patient Details  Name: Gregory Torres MRN: [id number] Date of Birth: 01-18-1966  Transition of Care [facility]) CM/SW Contact:    Dalila Camellia SAUNDERS, LCSW Phone Number: 03/09/2023, 4:09 PM   Clinical Narrative:  Patient has insurance and a PCP.  Patient does not have any SDOH needs, no anticipated TOC needs at this time.   Transition of Care Asessment: Insurance and Status: Insurance coverage has been reviewed Patient has primary care physician: Yes Home environment has been reviewed: Home with wife Prior level of function:: Indep Prior/Current Home Services: No current home services Social Drivers of Health Review: SDOH reviewed no interventions necessary Readmission risk has been reviewed: Yes Transition of care needs: no transition of care needs at this time

## 2023-03-09 NOTE — Plan of Care (Signed)

## 2023-03-09 NOTE — Discharge Instructions (Signed)
 [facility] ______[facility] LAPAROSCOPIC SURGERY: POST OP INSTRUCTIONS Always review your discharge instruction sheet given to you by the facility where your surgery was performed. IF YOU HAVE DISABILITY OR FAMILY LEAVE FORMS, YOU MUST BRING THEM TO THE OFFICE FOR PROCESSING.   DO NOT GIVE THEM TO YOUR DOCTOR.  A prescription for pain medication may be given to you upon discharge.  Take your pain medication as prescribed, if needed.  If narcotic pain medicine is not needed, then you may take acetaminophen  (Tylenol ) or ibuprofen (Advil) as needed. Take your usually prescribed medications unless otherwise directed. If you need a refill on your pain medication, please contact your pharmacy.  They will contact our office to request authorization. Prescriptions will not be filled after 5pm or on week-ends. You should follow a light diet the first few days after arrival home, such as soup and crackers, etc.  Be sure to include lots of fluids daily. Most patients will experience some swelling and bruising in the area of the incisions.  Ice packs will help.  Swelling and bruising can take several days to resolve.  It is common to experience some constipation if taking pain medication after surgery.  Increasing fluid intake and taking a stool softener (such as Colace) will usually help or prevent this problem from occurring.  A mild laxative (Milk of Magnesia or Miralax) should be taken according to package instructions if there are no bowel movements after 48 hours. Unless discharge instructions indicate otherwise, you may remove your bandages 24-48 hours after surgery, and you may shower at that time.  You may have steri-strips (small skin tapes) in place directly over the incision.  These strips should be left on the skin for 7-10 days.  If your surgeon used skin glue on the incision, you may shower in 24 hours.  The glue will flake off over the next 2-3 weeks.  Any sutures or staples will be  removed at the office during your follow-up visit. ACTIVITIES:  You may resume regular (light) daily activities beginning the next day--such as daily self-care, walking, climbing stairs--gradually increasing activities as tolerated.  You may have sexual intercourse when it is comfortable.  Refrain from any heavy lifting or straining until approved by your doctor. You may drive when you are no longer taking prescription pain medication, you can comfortably wear a seatbelt, and you can safely maneuver your car and apply brakes. RETURN TO WORK:  __________________________________________________________ Rosine should see your doctor in the office for a follow-up appointment approximately 2-3 weeks after your surgery.  Make sure that you call for this appointment within a day or two after you arrive home to insure a convenient appointment time. OTHER INSTRUCTIONS: OK TO SHOWER NO LIFTING MORE THAN 15 POUNDS FOR 2 WEEKS__________________________________________________________________________________________________________________________ __________________________________________________________________________________________________________________________ WHEN TO CALL YOUR DOCTOR: Fever over 101.0 Inability to urinate Continued bleeding from incision. Increased pain, redness, or drainage from the incision. Increasing abdominal pain  The clinic staff is available to answer your questions during regular business hours.  Please don't hesitate to call and ask to speak to one of the nurses for clinical concerns.  If you have a medical emergency, go to the nearest emergency room or call 911.  A surgeon from [facility] is always on call at the hospital. [address], [address], [place], [place]  [address] ? [address] [address], [place], [place]   [address] [phone] ? [phone] ? FAX [phone] Web site: [email or web address]

## 2023-03-09 NOTE — Discharge Summary (Signed)
 Physician Discharge Summary   Patient: Gregory Torres MRN: [id number] DOB: Apr 23, 1965  Admit date:     03/07/2023  Discharge date: 03/09/23  Discharge Physician: Lorane Poland   PCP: Marvene Prentice SAUNDERS, FNP   Recommendations at discharge:   With primary care physician to recheck LFTs in 1 week  Discharge Diagnoses: Principal Problem:   Acute cholecystitis Active Problems:   Hypertension   Mixed hyperlipidemia   LFT elevation  Resolved Problems:   * No resolved hospital problems. [facility] Course: Gregory Torres is a 58 year old man presenting with abdominal pain. He was admitted for acute cholecystitis with LFT elevation.  Seen by gastroenterology and general surgery.  Patient underwent laparoscopic cholecystectomy on 1/7, intraoperative cholangiogram was unable to be performed to completely rule out a CBD stone.  LFTs were downtrending on 1/8 but patient will need to follow-up with his primary care physician to ensure ongoing downtrend.  On evaluation 1/8 patient was meeting his postoperative milestones with adequate pain control, having flatulence, and tolerating diet.  He will be discharging home today to follow-up outpatient with PCP and general surgery.  PDMP reviewed. Consultants: General Surgery Procedures performed: Laparoscopic cholecystectomy Disposition: Home Diet recommendation:  Discharge Diet Orders (From admission, onward)     Start     Ordered   03/09/23 0000  Diet general        03/09/23 1515           Regular diet DISCHARGE MEDICATION: Allergies as of 03/09/2023       Reactions   Losartan Other (See Comments)   Caused sweating and clamminess        Medication List     STOP taking these medications    escitalopram 5 MG tablet Commonly known as: LEXAPRO       TAKE these medications    amLODipine  10 MG tablet Commonly known as: NORVASC  Take 1 tablet (10 mg total) by mouth daily.   carvedilol  12.5 MG tablet Commonly known as: COREG  Take  1 tablet (12.5 mg total) by mouth 2 (two) times daily.   ezetimibe  10 MG tablet Commonly known as: ZETIA  Take 10 mg by mouth daily.   HM Ibuprofen 200 MG tablet Generic drug: ibuprofen Take 200-400 mg by mouth every 6 (six) hours as needed for mild pain (pain score 1-3) or headache.   HYDROcodone -acetaminophen  5-325 MG tablet Commonly known as: NORCO/VICODIN Take 1-2 tablets by mouth every 6 (six) hours as needed for moderate pain (pain score 4-6).   ondansetron  4 MG disintegrating tablet Commonly known as: ZOFRAN -ODT Take 1 tablet (4 mg total) by mouth 4 (four) times daily as needed for nause / vomiting What changed: reasons to take this   pantoprazole  40 MG tablet Commonly known as: PROTONIX  Take 40 mg by mouth daily before breakfast.   Repatha  SureClick 140 MG/ML Soaj Generic drug: Evolocumab  Inject 140 mg into the skin every 14 (fourteen) days.   TYLENOL  500 MG tablet Generic drug: acetaminophen  Take 500-1,000 mg by mouth every 6 (six) hours as needed for mild pain (pain score 1-3) or headache.        Follow-up Information     Maczis, Puja Gosai, PA-C. Go on 03/31/2023.   Specialty: General Surgery Why: 3:30 PM, please arrive 30 min prior to appointment time to check in. Contact information: [address] [facility] [place] [address] [phone]                Discharge Exam: Gregory  Torres   03/07/23 1334 03/08/23 0916 03/09/23 0349  Weight: 93 kg 92.2 kg 96.5 kg   Constitutional:  Normal appearance. Non toxic-appearing.  HENT: Head Normocephalic and atraumatic.  Mucous membranes are moist.  Eyes:  Extraocular intact. Conjunctivae normal. Pupils are equal, round, and reactive to light.  Cardiovascular: Rate and Rhythm: Normal rate and regular rhythm.  Pulmonary: Non labored, symmetric rise of chest wall.  Musculoskeletal:  Normal range of motion. Abdomen: Surgical sites healing appropriately, no active oozing or  bleeding.  Appropriately tender around surgical sites Skin: warm and dry. not jaundiced.  Neurological: No focal deficit present. alert. Oriented. Psychiatric: Mood and Affect congruent.    Condition at discharge: stable  The results of significant diagnostics from this hospitalization (including imaging, microbiology, ancillary and laboratory) are listed below for reference.   Imaging Studies: Gregory ABDOMEN MRCP W WO CONTAST Result Date: 03/08/2023 CLINICAL DATA:  Cholelithiasis, abdominal pain EXAM: MRI ABDOMEN WITHOUT AND WITH CONTRAST (INCLUDING MRCP) TECHNIQUE: Multiplanar multisequence Gregory imaging of the abdomen was performed both before and after the administration of intravenous contrast. Heavily T2-weighted images of the biliary and pancreatic ducts were obtained, and three-dimensional MRCP images were rendered by post processing. CONTRAST:  9 mL Gadavist  gadolinium contrast IV COMPARISON:  Same-day right upper quadrant ultrasound FINDINGS: Lower chest: No acute abnormality. Hepatobiliary: No solid liver abnormality is seen. Small gallstones near the gallbladder neck as well as sludge and probable tiny stones within the gallbladder neck and cystic duct (series 23, image 17, 19). Gallbladder wall thickening and fat stranding. No gallstones within the common bile duct nor biliary ductal dilatation. Pancreas: Unremarkable. No pancreatic ductal dilatation or surrounding inflammatory changes. Spleen: Normal in size without significant abnormality. Adrenals/Urinary Tract: Adrenal glands are unremarkable. Kidneys are normal, without renal calculi, solid lesion, or hydronephrosis. Stomach/Bowel: Stomach is within normal limits. No evidence of bowel wall thickening, distention, or inflammatory changes. Vascular/Lymphatic: No significant vascular findings are present. No enlarged abdominal lymph nodes. Other: No abdominal wall hernia or abnormality. No ascites. Musculoskeletal: No acute or significant osseous  findings. IMPRESSION: 1. Small gallstones near the gallbladder neck as well as sludge and probable tiny stones within the gallbladder neck and cystic duct. Gallbladder wall thickening and fat stranding. Findings are consistent with acute cholecystitis. 2. No gallstones within the common bile duct nor biliary ductal dilatation. Electronically Signed   By: Marolyn JONETTA Jaksch M.D.   On: 03/08/2023 07:36   US  Abdomen Limited RUQ (LIVER/GB) Result Date: 03/07/2023 CLINICAL DATA:  Upper abdominal pain EXAM: ULTRASOUND ABDOMEN LIMITED RIGHT UPPER QUADRANT COMPARISON:  Ultrasound 03/05/2023.  CT scan 03/04/2023. FINDINGS: Gallbladder: Distended gallbladder. Wall thickening up to 6 mm. Layering stones and sludge. No reported sonographic Murphy's sign. However the patient as per the sonographer had received pain medication. Common bile duct: Diameter: 5 mm Liver: No focal lesion identified. Within normal limits in parenchymal echogenicity. Portal vein is patent on color Doppler imaging with normal direction of blood flow towards the liver. Other: None. IMPRESSION: Dilated gallbladder with sludge and stones. Progressive wall thickening compared to previous. Please correlate for other clinical evidence of acute cholecystitis. If needed HIDA scan could be considered. No ductal dilatation. Electronically Signed   By: Ranell Bring M.D.   On: 03/07/2023 15:20   US  Abdomen Limited RUQ (LIVER/GB) Result Date: 03/05/2023 CLINICAL DATA:  58 year old male with history of intermittent epigastric pain for 1 month. EXAM: ULTRASOUND ABDOMEN LIMITED RIGHT UPPER QUADRANT COMPARISON:  No priors. FINDINGS: Gallbladder: In  the dependent portion of the gallbladder there is some echogenic nonshadowing material indicative of biliary sludge. Additionally, there is a 6 mm echogenic focus with posterior acoustic shadowing indicative of a gallstone. Gallbladder is moderately distended. Gallbladder wall thickness is normal at 3 mm. No  pericholecystic fluid. Per report from the sonographer, there was no sonographic Murphy's sign on examination. Common bile duct: Diameter: 4 mm Liver: No focal lesion identified. Within normal limits in parenchymal echogenicity. Portal vein is patent on color Doppler imaging with normal direction of blood flow towards the liver. Other: None. IMPRESSION: 1. Study is positive for a combination of biliary sludge and a gallstone in the gallbladder. However, there are no imaging findings to suggest an acute cholecystitis at this time. Electronically Signed   By: Toribio Aye M.D.   On: 03/05/2023 08:36   CT ABDOMEN PELVIS W CONTRAST Result Date: 03/05/2023 CLINICAL DATA:  Upper abdominal pain on the left side for 2 weeks. Worsening this evening. EXAM: CT ABDOMEN AND PELVIS WITH CONTRAST TECHNIQUE: Multidetector CT imaging of the abdomen and pelvis was performed using the standard protocol following bolus administration of intravenous contrast. RADIATION DOSE REDUCTION: This exam was performed according to the departmental dose-optimization program which includes automated exposure control, adjustment of the mA and/or kV according to patient size and/or use of iterative reconstruction technique. CONTRAST:  OMNIPAQUE  IOHEXOL  300 MG/ML  SOLN COMPARISON:  None Available. FINDINGS: Lower chest: No acute abnormality. Hepatobiliary: Unremarkable liver. Trace pericholecystic fluid about the nondistended bladder. Possible tiny gallstones in the gallbladder neck. No biliary dilation. Pancreas: Unremarkable. Spleen: Unremarkable. Adrenals/Urinary Tract: Normal adrenal glands. No urinary calculi or hydronephrosis. Bladder is unremarkable. Stomach/Bowel: Normal caliber large and small bowel. No bowel wall thickening. The appendix is normal.Stomach is within normal limits. Vascular/Lymphatic: Aortic atherosclerosis. No enlarged abdominal or pelvic lymph nodes. Reproductive: Unremarkable. Other: No free intraperitoneal  fluid or air. Musculoskeletal: No acute fracture. Chronic bilateral L5 pars defects with grade 1 anterolisthesis of L5. IMPRESSION: 1. Trace pericholecystic fluid about the nondistended gallbladder. Possible tiny gallstones in the gallbladder neck. Consider right upper quadrant ultrasound for further evaluation. 2. Otherwise no acute abnormality in the abdomen or pelvis. 3. Chronic L5 spondylolysis with spondylolisthesis. Aortic Atherosclerosis (ICD10-I70.0). Electronically Signed   By: Norman Gatlin M.D.   On: 03/05/2023 01:02    Microbiology: No results found for this or any previous visit.  Labs: CBC: Recent Labs  Lab 03/04/23 2124 03/07/23 1525 03/08/23 0315 03/09/23 0427  WBC 14.4* 11.6* 10.2 7.0  NEUTROABS  --  10.9*  --   --   HGB 16.8 15.0 13.2 12.8*  HCT 47.6 43.0 38.5* 37.3*  MCV 84.8 87.9 88.5 88.8  PLT 158 174 175 188   Basic Metabolic Panel: Recent Labs  Lab 03/04/23 2211 03/07/23 1525 03/08/23 0315 03/09/23 0427  NA 137 134* 134* 131*  K 3.5 4.0 3.9 4.0  CL 101 98 99 96*  CO2 25 29 26 26   GLUCOSE 129* 135* 129* 169*  BUN 22* 11 20 18   CREATININE 1.06 1.07 1.24 1.10  CALCIUM  9.2 8.8* 8.4* 8.4*  MG  --   --  1.8  --    Liver Function Tests: Recent Labs  Lab 03/04/23 2211 03/07/23 1525 03/08/23 0315 03/09/23 0427  AST 20 412* 189* 110*  ALT 21 296* 272* 184*  ALKPHOS 58 126 120 121  BILITOT 0.6 3.6* 5.6* 4.6*  PROT 7.3 6.6 6.2* 6.1*  ALBUMIN 4.6 3.5 3.1* 2.8*   CBG:  Recent Labs  Lab 03/07/23 2244  GLUCAP 139*    Discharge time spent: greater than 30 minutes.  Signed: Taneesha Edgin, DO [facility] 03/09/2023

## 2023-03-09 NOTE — Progress Notes (Signed)
   03/09/23   To Whom it may concern,  Gregory Torres was hospitalized at [facility] from 03/07/2023 through 03/09/23. He has been cleared to return to work on but not before 03/14/2023.  He needs to avoid heavy lifting for 2 weeks, or until cleared by his physician  Should you have further questions please feel free to contact our office.  Be advised that no medical information will be divulged without a signed HIPAA release from the patient.    Sincerely, Normon Pettijohn, DO  [facility] Office  [phone]

## 2023-03-09 NOTE — Progress Notes (Signed)
 Patient discharged via wheelchair with wife at side. Patient had all belongings clothes and cell phone. Patient was in no distress.

## 2023-03-09 NOTE — Progress Notes (Signed)
 Patient is alert and oriented x4. Ambulated several times from bed to bathroom with only standby assistance. Lap sites are clean, dry and intact. Patient's skin is yellow from jaundice. AST and ALT have both decreased (now 110 AND 184). Refused pain med last night. Denied additional needs.

## 2023-03-22 ENCOUNTER — Other Ambulatory Visit (HOSPITAL_COMMUNITY): Payer: Self-pay

## 2023-03-29 DIAGNOSIS — G4734 Idiopathic sleep related nonobstructive alveolar hypoventilation: Secondary | ICD-10-CM | POA: Diagnosis not present

## 2023-03-29 DIAGNOSIS — I10 Essential (primary) hypertension: Secondary | ICD-10-CM | POA: Diagnosis not present

## 2023-03-29 DIAGNOSIS — R0683 Snoring: Secondary | ICD-10-CM | POA: Diagnosis not present

## 2023-03-29 NOTE — Progress Notes (Signed)
Cardiology Office Note:  .   Date:  04/01/2023  ID:  Gregory Torres, DOB 22-Jul-1965, MRN [id number] PCP: Soundra Pilon, FNP  [facility] Providers Cardiologist:  None { History of Present Illness: .   Gregory Torres is a 58 y.o. male with history of CAD, HTN, HLD who presents for follow-up.    History of Present Illness   The patient, with nonobstructive coronary artery disease and hypertension, presents for follow-up.  He is not experiencing any chest pain or dyspnea. He has started exercising, using a stationary bike at home occasionally, and is mindful of his diet, avoiding red meats and focusing on chicken and fish.  His hypertension is well controlled with amlodipine and carvedilol. His blood pressure is at goal today.  He has been managing his cholesterol with Repatha and Zetia but stopped both due to bilateral knee pain, which he attributes to these medications. The pain improved after discontinuing them. He is considering restarting Zetia to manage his LDL cholesterol levels, which were previously brought down significantly by the medication.  He underwent gallbladder removal surgery approximately four weeks ago after experiencing acute cholecystitis. He describes significant pain prior to the surgery and was given pain medication that made him drowsy.          Problem list 1.  Hypertension 2.  Hyperlipidemia - T chol 89, HDL 45, LDL 28, TG 79 3. CAD -25-49% LCX/OM -CAC 161 (88th percentile)    ROS: All other ROS reviewed and negative. Pertinent positives noted in the HPI.     Studies Reviewed: Marland Kitchen        CCTA 10/14/2020 IMPRESSION: 1. Coronary calcium score of 161. This was 88th percentile for age and sex matched control.   2.  Normal coronary origin with right dominance.   3.  Mild atherosclerosis.  CAD RADS 2.   4.  Recommend preventive therapy and risk factor modification.   5. This study has been submitted for FFR analysis for assessment of the  LCx and OM. Physical Exam:   VS:  BP 120/80 (BP Location: Left Arm, Patient Position: Sitting, Cuff Size: Normal)   Pulse 72   Ht 5\' 7"  (1.702 m)   Wt 197 lb (89.4 kg)   BMI 30.85 kg/m    Wt Readings from Last 3 Encounters:  04/01/23 197 lb (89.4 kg)  03/09/23 212 lb 11.9 oz (96.5 kg)  10/01/22 202 lb 6.4 oz (91.8 kg)    GEN: Well nourished, well developed in no acute distress NECK: No JVD; No carotid bruits CARDIAC: RRR, no murmurs, rubs, gallops RESPIRATORY:  Clear to auscultation without rales, wheezing or rhonchi  ABDOMEN: Soft, non-tender, non-distended EXTREMITIES:  No edema; No deformity  ASSESSMENT AND PLAN: .   Assessment and Plan    Hyperlipidemia LDL cholesterol 28, previously on Repatha and Zetia, but discontinued due to bilateral knee pain. Willing to retry Zetia to achieve LDL goal of less than 70. -Resume Zetia 10mg  daily. -Check lipid panel in 2 months with PCP and forward results.  Hypertension Well controlled on Carvedilol and Amlodipine. -Continue Carvedilol 12.5mg  BID and Amlodipine 10mg  daily. -coreg refilled today.   Nonobstructive CAD No current symptoms of chest pain or trouble breathing. Emphasized importance of cholesterol management over aspirin therapy. -Continue current management and monitor closely.              Follow-up: Return in about 1 year (around 03/31/2024).   Signed, Lenna Gilford. Flora Lipps, MD, [facility]  [facility]  [address] [place], [place] [address] [phone]  9:52 AM

## 2023-04-01 ENCOUNTER — Ambulatory Visit: Payer: BC Managed Care – PPO | Attending: Cardiovascular Disease | Admitting: Cardiovascular Disease

## 2023-04-01 ENCOUNTER — Encounter: Payer: Self-pay | Admitting: Cardiovascular Disease

## 2023-04-01 VITALS — BP 120/80 | HR 72 | Ht 67.0 in | Wt 197.0 lb

## 2023-04-01 DIAGNOSIS — I2510 Atherosclerotic heart disease of native coronary artery without angina pectoris: Secondary | ICD-10-CM

## 2023-04-01 DIAGNOSIS — E782 Mixed hyperlipidemia: Secondary | ICD-10-CM | POA: Diagnosis not present

## 2023-04-01 DIAGNOSIS — R931 Abnormal findings on diagnostic imaging of heart and coronary circulation: Secondary | ICD-10-CM

## 2023-04-01 MED ORDER — EZETIMIBE 10 MG PO TABS: 10.0000 mg | ORAL_TABLET | Freq: Every day | ORAL | 3 refills | Status: AC

## 2023-04-01 MED ORDER — CARVEDILOL 12.5 MG PO TABS: 12.5000 mg | ORAL_TABLET | Freq: Two times a day (BID) | ORAL | 3 refills | Status: AC

## 2023-04-01 NOTE — Patient Instructions (Addendum)
Medication Instructions:  - START Zetia 10mg  daily    *If you need a refill on your cardiac medications before your next appointment, please call your pharmacy*   Lab Work: None    If you have labs (blood work) drawn today and your tests are completely normal, you will receive your results only by: MyChart Message (if you have MyChart) OR A paper copy in the mail If you have any lab test that is abnormal or we need to change your treatment, we will call you to review the results.   Testing/Procedures: None    Follow-Up: At [facility], you and your health needs are our priority.  As part of our continuing mission to provide you with exceptional heart care, we have created designated Provider Care Teams.  These Care Teams include your primary Cardiologist (physician) and Advanced Practice Providers (APPs -  Physician Assistants and Nurse Practitioners) who all work together to provide you with the care you need, when you need it.  We recommend signing up for the patient portal called "MyChart".  Sign up information is provided on this After Visit Summary.  MyChart is used to connect with patients for Virtual Visits (Telemedicine).  Patients are able to view lab/test results, encounter notes, upcoming appointments, etc.  Non-urgent messages can be sent to your provider as well.   To learn more about what you can do with MyChart, go to [email or web address].    Your next appointment:   1 year(s)  The format for your next appointment:   In Person  Provider:   Lennie Odor, MD     Other Instructions PLEASE HAVE PRIMARY CARE  DOCTOR SEND RESULTS OF LIPID PANEL BLOODWORK. FAX: [phone]

## 2023-04-05 ENCOUNTER — Encounter: Payer: Self-pay | Admitting: Cardiovascular Disease

## 2023-04-05 NOTE — Telephone Encounter (Signed)
Spoke to patient, advised short term oral antiinflammatory use is ok but for chronic pain topical NSAID's is safe option

## 2023-06-03 DIAGNOSIS — K219 Gastro-esophageal reflux disease without esophagitis: Secondary | ICD-10-CM | POA: Diagnosis not present

## 2023-06-03 DIAGNOSIS — Z0000 Encounter for general adult medical examination without abnormal findings: Secondary | ICD-10-CM | POA: Diagnosis not present

## 2023-06-03 DIAGNOSIS — R7301 Impaired fasting glucose: Secondary | ICD-10-CM | POA: Diagnosis not present

## 2023-06-03 DIAGNOSIS — E782 Mixed hyperlipidemia: Secondary | ICD-10-CM | POA: Diagnosis not present

## 2023-06-03 DIAGNOSIS — M791 Myalgia, unspecified site: Secondary | ICD-10-CM | POA: Diagnosis not present

## 2023-06-03 DIAGNOSIS — Z125 Encounter for screening for malignant neoplasm of prostate: Secondary | ICD-10-CM | POA: Diagnosis not present

## 2023-06-03 DIAGNOSIS — I10 Essential (primary) hypertension: Secondary | ICD-10-CM | POA: Diagnosis not present

## 2023-08-01 DIAGNOSIS — L821 Other seborrheic keratosis: Secondary | ICD-10-CM | POA: Diagnosis not present

## 2023-11-03 DIAGNOSIS — R0789 Other chest pain: Secondary | ICD-10-CM | POA: Diagnosis not present

## 2023-11-03 DIAGNOSIS — Z86018 Personal history of other benign neoplasm: Secondary | ICD-10-CM | POA: Diagnosis not present

## 2023-11-03 DIAGNOSIS — K219 Gastro-esophageal reflux disease without esophagitis: Secondary | ICD-10-CM | POA: Diagnosis not present

## 2023-12-09 DIAGNOSIS — E782 Mixed hyperlipidemia: Secondary | ICD-10-CM | POA: Diagnosis not present

## 2023-12-09 DIAGNOSIS — R7301 Impaired fasting glucose: Secondary | ICD-10-CM | POA: Diagnosis not present

## 2023-12-09 DIAGNOSIS — K219 Gastro-esophageal reflux disease without esophagitis: Secondary | ICD-10-CM | POA: Diagnosis not present

## 2023-12-09 DIAGNOSIS — I10 Essential (primary) hypertension: Secondary | ICD-10-CM | POA: Diagnosis not present

## 2023-12-09 DIAGNOSIS — Z23 Encounter for immunization: Secondary | ICD-10-CM | POA: Diagnosis not present

## 2024-02-20 DIAGNOSIS — D123 Benign neoplasm of transverse colon: Secondary | ICD-10-CM | POA: Diagnosis not present

## 2024-02-20 DIAGNOSIS — D124 Benign neoplasm of descending colon: Secondary | ICD-10-CM | POA: Diagnosis not present

## 2024-02-20 DIAGNOSIS — Z860101 Personal history of adenomatous and serrated colon polyps: Secondary | ICD-10-CM | POA: Diagnosis not present

## 2024-02-20 DIAGNOSIS — K648 Other hemorrhoids: Secondary | ICD-10-CM | POA: Diagnosis not present

## 2024-02-20 DIAGNOSIS — Z09 Encounter for follow-up examination after completed treatment for conditions other than malignant neoplasm: Secondary | ICD-10-CM | POA: Diagnosis not present

## 2024-02-20 DIAGNOSIS — D122 Benign neoplasm of ascending colon: Secondary | ICD-10-CM | POA: Diagnosis not present

## 2024-02-20 DIAGNOSIS — D125 Benign neoplasm of sigmoid colon: Secondary | ICD-10-CM | POA: Diagnosis not present
